# Patient Record
Sex: Male | Born: 1975 | Race: Black or African American | Hispanic: No | Marital: Single | Smoking: Current every day smoker
Health system: Southern US, Community
[De-identification: ages and names within clinical notes are randomized; demographics above are authoritative.]

## PROBLEM LIST (undated history)

## (undated) DIAGNOSIS — I10 Essential (primary) hypertension: Secondary | ICD-10-CM

## (undated) DIAGNOSIS — E119 Type 2 diabetes mellitus without complications: Secondary | ICD-10-CM

---

## 2020-02-16 ENCOUNTER — Encounter (HOSPITAL_COMMUNITY): Payer: Self-pay | Admitting: Emergency Medicine

## 2020-02-16 ENCOUNTER — Emergency Department (HOSPITAL_COMMUNITY): Payer: No Typology Code available for payment source

## 2020-02-16 ENCOUNTER — Emergency Department (HOSPITAL_COMMUNITY)
Admission: EM | Admit: 2020-02-16 | Discharge: 2020-02-17 | Disposition: A | Discharge status: Home / Self Care | Payer: No Typology Code available for payment source | Attending: Emergency Medicine | Admitting: Emergency Medicine

## 2020-02-16 ENCOUNTER — Other Ambulatory Visit: Payer: Self-pay

## 2020-02-16 DIAGNOSIS — Y999 Unspecified external cause status: Secondary | ICD-10-CM | POA: Insufficient documentation

## 2020-02-16 DIAGNOSIS — I10 Essential (primary) hypertension: Secondary | ICD-10-CM | POA: Diagnosis not present

## 2020-02-16 DIAGNOSIS — R109 Unspecified abdominal pain: Secondary | ICD-10-CM | POA: Diagnosis not present

## 2020-02-16 DIAGNOSIS — R0789 Other chest pain: Secondary | ICD-10-CM | POA: Diagnosis not present

## 2020-02-16 DIAGNOSIS — M545 Low back pain: Secondary | ICD-10-CM | POA: Insufficient documentation

## 2020-02-16 DIAGNOSIS — Y92410 Unspecified street and highway as the place of occurrence of the external cause: Secondary | ICD-10-CM | POA: Insufficient documentation

## 2020-02-16 DIAGNOSIS — Y93I9 Activity, other involving external motion: Secondary | ICD-10-CM | POA: Diagnosis not present

## 2020-02-16 DIAGNOSIS — K8689 Other specified diseases of pancreas: Secondary | ICD-10-CM

## 2020-02-16 DIAGNOSIS — T1490XA Injury, unspecified, initial encounter: Secondary | ICD-10-CM

## 2020-02-16 DIAGNOSIS — Z7984 Long term (current) use of oral hypoglycemic drugs: Secondary | ICD-10-CM | POA: Insufficient documentation

## 2020-02-16 DIAGNOSIS — T07XXXA Unspecified multiple injuries, initial encounter: Secondary | ICD-10-CM

## 2020-02-16 DIAGNOSIS — R55 Syncope and collapse: Secondary | ICD-10-CM | POA: Insufficient documentation

## 2020-02-16 DIAGNOSIS — F17210 Nicotine dependence, cigarettes, uncomplicated: Secondary | ICD-10-CM | POA: Diagnosis not present

## 2020-02-16 DIAGNOSIS — Z79899 Other long term (current) drug therapy: Secondary | ICD-10-CM | POA: Diagnosis not present

## 2020-02-16 DIAGNOSIS — T148XXA Other injury of unspecified body region, initial encounter: Secondary | ICD-10-CM

## 2020-02-16 DIAGNOSIS — E119 Type 2 diabetes mellitus without complications: Secondary | ICD-10-CM | POA: Insufficient documentation

## 2020-02-16 HISTORY — DX: Essential (primary) hypertension: I10

## 2020-02-16 HISTORY — DX: Type 2 diabetes mellitus without complications: E11.9

## 2020-02-16 LAB — CBC
HCT: 49.8 % (ref 39.0–52.0)
Hemoglobin: 16 g/dL (ref 13.0–17.0)
MCH: 29.3 pg (ref 26.0–34.0)
MCHC: 32.1 g/dL (ref 30.0–36.0)
MCV: 91.2 fL (ref 80.0–100.0)
Platelets: 322 10*3/uL (ref 150–400)
RBC: 5.46 MIL/uL (ref 4.22–5.81)
RDW: 13.1 % (ref 11.5–15.5)
WBC: 6.4 10*3/uL (ref 4.0–10.5)
nRBC: 0 % (ref 0.0–0.2)

## 2020-02-16 LAB — I-STAT CHEM 8, ED
BUN: 8 mg/dL (ref 6–20)
Calcium, Ion: 1.13 mmol/L — ABNORMAL LOW (ref 1.15–1.40)
Chloride: 103 mmol/L (ref 98–111)
Creatinine, Ser: 0.8 mg/dL (ref 0.61–1.24)
Glucose, Bld: 225 mg/dL — ABNORMAL HIGH (ref 70–99)
HCT: 47 % (ref 39.0–52.0)
Hemoglobin: 16 g/dL (ref 13.0–17.0)
Potassium: 3.5 mmol/L (ref 3.5–5.1)
Sodium: 140 mmol/L (ref 135–145)
TCO2: 26 mmol/L (ref 22–32)

## 2020-02-16 LAB — SAMPLE TO BLOOD BANK

## 2020-02-16 LAB — COMPREHENSIVE METABOLIC PANEL
ALT: 31 U/L (ref 0–44)
AST: 26 U/L (ref 15–41)
Albumin: 3.7 g/dL (ref 3.5–5.0)
Alkaline Phosphatase: 75 U/L (ref 38–126)
Anion gap: 11 (ref 5–15)
BUN: 7 mg/dL (ref 6–20)
CO2: 23 mmol/L (ref 22–32)
Calcium: 9 mg/dL (ref 8.9–10.3)
Chloride: 104 mmol/L (ref 98–111)
Creatinine, Ser: 0.92 mg/dL (ref 0.61–1.24)
GFR calc Af Amer: >60 mL/min (ref >60)
GFR calc non Af Amer: >60 mL/min (ref >60)
Glucose, Bld: 232 mg/dL — ABNORMAL HIGH (ref 70–99)
Potassium: 3.5 mmol/L (ref 3.5–5.1)
Sodium: 138 mmol/L (ref 135–145)
Total Bilirubin: 1.1 mg/dL (ref 0.3–1.2)
Total Protein: 6.3 g/dL — ABNORMAL LOW (ref 6.5–8.1)

## 2020-02-16 LAB — URINALYSIS, ROUTINE W REFLEX MICROSCOPIC
Bilirubin Urine: NEGATIVE
Glucose, UA: 150 mg/dL — AB
Hgb urine dipstick: NEGATIVE
Ketones, ur: NEGATIVE mg/dL
Nitrite: NEGATIVE
Protein, ur: 30 mg/dL — AB
Specific Gravity, Urine: >1.046 — ABNORMAL HIGH (ref 1.005–1.030)
WBC, UA: >50 WBC/hpf — ABNORMAL HIGH (ref 0–5)
pH: 5 (ref 5.0–8.0)

## 2020-02-16 LAB — LACTIC ACID, PLASMA: Lactic Acid, Venous: 2.2 mmol/L (ref 0.5–1.9)

## 2020-02-16 LAB — ETHANOL: Alcohol, Ethyl (B): <10 mg/dL (ref <10)

## 2020-02-16 LAB — PROTIME-INR
INR: 0.9 (ref 0.8–1.2)
Prothrombin Time: 12 seconds (ref 11.4–15.2)

## 2020-02-16 MED ORDER — HYDROMORPHONE HCL 1 MG/ML IJ SOLN
1.0000 mg | Freq: Once | INTRAMUSCULAR | Status: AC
  Administered 2020-02-16: 1 mg via INTRAVENOUS
  Filled 2020-02-16: qty 1

## 2020-02-16 MED ORDER — SODIUM CHLORIDE 0.9 % IV BOLUS
1000.0000 mL | Freq: Once | INTRAVENOUS | Status: AC
  Administered 2020-02-16: 1000 mL via INTRAVENOUS

## 2020-02-16 MED ORDER — IOHEXOL 300 MG/ML  SOLN
100.0000 mL | Freq: Once | INTRAMUSCULAR | Status: AC | PRN
  Administered 2020-02-16: 100 mL via INTRAVENOUS

## 2020-02-16 MED ORDER — CYCLOBENZAPRINE HCL 10 MG PO TABS
10.0000 mg | ORAL_TABLET | Freq: Two times a day (BID) | ORAL | 0 refills | Status: DC | PRN
Start: 2020-02-16 — End: 2023-08-22

## 2020-02-16 MED ORDER — OXYCODONE HCL 5 MG PO TABS
5.0000 mg | ORAL_TABLET | Freq: Once | ORAL | Status: AC
  Administered 2020-02-17: 5 mg via ORAL
  Filled 2020-02-16: qty 1

## 2020-02-16 MED ORDER — TETANUS-DIPHTH-ACELL PERTUSSIS 5-2.5-18.5 LF-MCG/0.5 IM SUSP: 0.5000 mL | Freq: Once | INTRAMUSCULAR | Status: DC
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
## 2020-02-16 NOTE — Discharge Instructions (Signed)
You were evaluated in the Emergency Department and after careful evaluation, we did not find any emergent condition requiring admission or further testing in the hospital.  Your exam/testing today is overall reassuring.  Your CT scans did not show any significant traumatic injuries.  We did find a mass on your pancreas, which we talked about.  It is important that you follow-up with a primary care within the next few weeks doctor to discuss this pancreatic mass and see what other testing is necessary.  We recommend Tylenol or Motrin at home for discomfort.  You can use the Flexeril muscle relaxer for more significant pain at home.  Please return to the Emergency Department if you experience any worsening of your condition.  We encourage you to follow up with a primary care provider.  Thank you for allowing [place] to be a part of your care.

## 2020-02-16 NOTE — Consult Note (Signed)
Responded to referral from day chaplain, pt unavailable, no family present. Let Security know that pt has been moved to rm 20.   Rev. Donnel Saxon Chaplain
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
## 2020-02-16 NOTE — ED Notes (Signed)
Called [employer] for pt to notify them he would not be able to make it to work.

## 2020-02-17 NOTE — ED Notes (Signed)
Pt successfully ambulated around room.

## 2021-07-11 ENCOUNTER — Other Ambulatory Visit: Payer: Self-pay

## 2021-07-11 ENCOUNTER — Ambulatory Visit (HOSPITAL_COMMUNITY)
Admission: EM | Admit: 2021-07-11 | Discharge: 2021-07-11 | Disposition: A | Discharge status: Home / Self Care | Payer: Self-pay | Attending: Internal Medicine | Admitting: Internal Medicine

## 2021-07-11 DIAGNOSIS — K047 Periapical abscess without sinus: Secondary | ICD-10-CM

## 2021-07-11 MED ORDER — AMOXICILLIN-POT CLAVULANATE 875-125 MG PO TABS
1.0000 | ORAL_TABLET | Freq: Two times a day (BID) | ORAL | 0 refills | Status: AC
Start: 2021-07-11 — End: 2021-07-21

## 2021-07-11 NOTE — ED Triage Notes (Signed)
Pt presents with left side dental pain & swelling X 3 days.

## 2021-07-11 NOTE — Discharge Instructions (Signed)
Go ahead and pick up antibiotic this evening.  Take twice daily until completely gone.  Please return should you develop any worsening swelling, fever or chills.  You need to follow-up with dentist.  I have given you a referral number.  Do your best to quit tobacco use.

## 2021-07-11 NOTE — ED Provider Notes (Signed)
[facility]    CSN: [id number] Arrival date & time: 07/11/21  1845      History   Chief Complaint Chief Complaint  Patient presents with   Dental Pain    HPI Bishop Vanderwerf is a 45 y.o. male with PMH of hypertension and diabetes and tobacco abuse presents to urgent care today with complaints of dental pain with swelling to left side of mouth for 3 days.  Patient reports history of dental infections in the past.  States he just applied for dental insurance with his job and should     Past Medical History:  Diagnosis Date   Diabetes mellitus without complication (HCC)    Hypertension     There are no problems to display for this patient.   No past surgical history on file.     Home Medications    Prior to Admission medications   Medication Sig Start Date End Date Taking? Authorizing Provider  amoxicillin-clavulanate (AUGMENTIN) 875-125 MG tablet Take 1 tablet by mouth 2 (two) times daily for 10 days. 07/11/21 07/21/21 Yes Rolla Etienne, NP  cyclobenzaprine (FLEXERIL) 10 MG tablet Take 1 tablet (10 mg total) by mouth 2 (two) times daily as needed for muscle spasms. 02/16/20   Sabas Sous, MD    Family History No family history on file.  Social History Social History   Tobacco Use   Smoking status: Every Day   Smokeless tobacco: Never  Substance Use Topics   Alcohol use: Not Currently   Drug use: Not Currently     Allergies   Patient has no known allergies.   Review of Systems As stated in HPI otherwise negative   Physical Exam Triage Vital Signs ED Triage Vitals  Enc Vitals Group     BP 07/11/21 1940 (!) 152/94     Pulse Rate 07/11/21 1940 63     Resp 07/11/21 1940 18     Temp 07/11/21 1940 98.6 F (37 C)     Temp Source 07/11/21 1940 Oral     SpO2 07/11/21 1940 96 %     Weight --      Height --      Head Circumference --      Peak Flow --      Pain Score 07/11/21 1943 7     Pain Loc --      Pain Edu? --      Excl. in GC?  --    No data found.  Updated Vital Signs BP (!) 152/94 (BP Location: Right Arm)   Pulse 63   Temp 98.6 F (37 C) (Oral)   Resp 18   SpO2 96%   Visual Acuity Right Eye Distance:   Left Eye Distance:   Bilateral Distance:    Right Eye Near:   Left Eye Near:    Bilateral Near:     Physical Exam Constitutional:      General: He is not in acute distress.    Appearance: Normal appearance. He is not ill-appearing or toxic-appearing.  HENT:     Mouth/Throat:     Mouth: Mucous membranes are moist. No angioedema.     Dentition: Dental tenderness, gingival swelling and dental caries present.     Tongue: No lesions. Tongue does not deviate from midline.     Palate: No mass and lesions.     Pharynx: Oropharynx is clear. Uvula midline. No pharyngeal swelling or posterior oropharyngeal erythema.      Comments: Swelling and erythema  to left lower gumline.  Swelling to left buccal region.  No palpated abscess, no obvious lesion Neurological:     Mental Status: He is alert.     UC Treatments / Results  Labs (all labs ordered are listed, but only abnormal results are displayed) Labs Reviewed - No data to display  EKG   Radiology No results found.  Procedures Procedures (including critical care time)  Medications Ordered in UC Medications - No data to display  Initial Impression / Assessment and Plan / UC Course  I have reviewed the triage vital signs and the nursing notes.  Pertinent labs & imaging results that were available during my care of the patient were reviewed by me and considered in my medical decision making (see chart for details).  Dental Infection -Significant erythema with mild swelling of the lower gumline. Multiple caries -hx tobacco abuse and DM2 -Augmentin BID x 10d -dental referral as will likely need multiple tooth extractoins -f/u for worsening or persistent symtpoms  Reviewed expections re: course of current medical issues. Questions  answered. Outlined signs and symptoms indicating need for more acute intervention. Pt verbalized understanding. AVS given    Final Clinical Impressions(s) / UC Diagnoses   Final diagnoses:  Dental infection     Discharge Instructions      Go ahead and pick up antibiotic this evening.  Take twice daily until completely gone.  Please return should you develop any worsening swelling, fever or chills.  You need to follow-up with dentist.  I have given you a referral number.  Do your best to quit tobacco use.     ED Prescriptions     Medication Sig Dispense Auth. Provider   amoxicillin-clavulanate (AUGMENTIN) 875-125 MG tablet Take 1 tablet by mouth 2 (two) times daily for 10 days. 28 tablet Rolla Etienne, NP      PDMP not reviewed this encounter.   Rolla Etienne, NP 07/11/21 2009

## 2022-01-25 ENCOUNTER — Emergency Department (HOSPITAL_COMMUNITY): Payer: Self-pay

## 2022-01-25 ENCOUNTER — Emergency Department (HOSPITAL_COMMUNITY)
Admission: EM | Admit: 2022-01-25 | Discharge: 2022-01-26 | Disposition: A | Discharge status: Home / Self Care | Payer: Self-pay | Attending: Emergency Medicine | Admitting: Emergency Medicine

## 2022-01-25 ENCOUNTER — Encounter (HOSPITAL_COMMUNITY): Payer: Self-pay

## 2022-01-25 ENCOUNTER — Other Ambulatory Visit: Payer: Self-pay

## 2022-01-25 DIAGNOSIS — R634 Abnormal weight loss: Secondary | ICD-10-CM | POA: Insufficient documentation

## 2022-01-25 DIAGNOSIS — R42 Dizziness and giddiness: Secondary | ICD-10-CM | POA: Insufficient documentation

## 2022-01-25 DIAGNOSIS — I10 Essential (primary) hypertension: Secondary | ICD-10-CM | POA: Insufficient documentation

## 2022-01-25 DIAGNOSIS — E1165 Type 2 diabetes mellitus with hyperglycemia: Secondary | ICD-10-CM | POA: Insufficient documentation

## 2022-01-25 LAB — COMPREHENSIVE METABOLIC PANEL
ALT: 30 U/L (ref 0–44)
AST: 22 U/L (ref 15–41)
Albumin: 4.3 g/dL (ref 3.5–5.0)
Alkaline Phosphatase: 89 U/L (ref 38–126)
Anion gap: 8 (ref 5–15)
BUN: 12 mg/dL (ref 6–20)
CO2: 26 mmol/L (ref 22–32)
Calcium: 9.1 mg/dL (ref 8.9–10.3)
Chloride: 101 mmol/L (ref 98–111)
Creatinine, Ser: 0.78 mg/dL (ref 0.61–1.24)
GFR, Estimated: >60 mL/min (ref >60)
Glucose, Bld: 273 mg/dL — ABNORMAL HIGH (ref 70–99)
Potassium: 3.8 mmol/L (ref 3.5–5.1)
Sodium: 135 mmol/L (ref 135–145)
Total Bilirubin: 1.1 mg/dL (ref 0.3–1.2)
Total Protein: 7.4 g/dL (ref 6.5–8.1)

## 2022-01-25 LAB — CBC WITH DIFFERENTIAL/PLATELET
Abs Immature Granulocytes: 0.03 10*3/uL (ref 0.00–0.07)
Basophils Absolute: 0 10*3/uL (ref 0.0–0.1)
Basophils Relative: 1 %
Eosinophils Absolute: 0.2 10*3/uL (ref 0.0–0.5)
Eosinophils Relative: 2 %
HCT: 49 % (ref 39.0–52.0)
Hemoglobin: 16.7 g/dL (ref 13.0–17.0)
Immature Granulocytes: 0 %
Lymphocytes Relative: 40 %
Lymphs Abs: 3.5 10*3/uL (ref 0.7–4.0)
MCH: 29.7 pg (ref 26.0–34.0)
MCHC: 34.1 g/dL (ref 30.0–36.0)
MCV: 87 fL (ref 80.0–100.0)
Monocytes Absolute: 0.8 10*3/uL (ref 0.1–1.0)
Monocytes Relative: 10 %
Neutro Abs: 4.2 10*3/uL (ref 1.7–7.7)
Neutrophils Relative %: 47 %
Platelets: 246 10*3/uL (ref 150–400)
RBC: 5.63 MIL/uL (ref 4.22–5.81)
RDW: 13.6 % (ref 11.5–15.5)
WBC: 8.8 10*3/uL (ref 4.0–10.5)
nRBC: 0 % (ref 0.0–0.2)

## 2022-01-25 LAB — URINALYSIS, ROUTINE W REFLEX MICROSCOPIC
Bacteria, UA: NONE SEEN
Bilirubin Urine: NEGATIVE
Glucose, UA: >=500 mg/dL — AB
Hgb urine dipstick: NEGATIVE
Ketones, ur: 5 mg/dL — AB
Leukocytes,Ua: NEGATIVE
Nitrite: NEGATIVE
Protein, ur: 30 mg/dL — AB
Specific Gravity, Urine: 1.038 — ABNORMAL HIGH (ref 1.005–1.030)
pH: 5 (ref 5.0–8.0)

## 2022-01-25 LAB — LIPASE, BLOOD: Lipase: 41 U/L (ref 11–51)

## 2022-01-25 MED ORDER — KETOROLAC TROMETHAMINE 15 MG/ML IJ SOLN
15.0000 mg | Freq: Once | INTRAMUSCULAR | Status: AC
  Administered 2022-01-25: 15 mg via INTRAVENOUS
  Filled 2022-01-25: qty 1

## 2022-01-25 MED ORDER — IOHEXOL 300 MG/ML  SOLN
100.0000 mL | Freq: Once | INTRAMUSCULAR | Status: AC | PRN
  Administered 2022-01-25: 100 mL via INTRAVENOUS

## 2022-01-25 MED ORDER — METOCLOPRAMIDE HCL 5 MG/ML IJ SOLN
10.0000 mg | Freq: Once | INTRAMUSCULAR | Status: AC
  Administered 2022-01-25: 10 mg via INTRAVENOUS
  Filled 2022-01-25: qty 2

## 2022-01-25 MED ORDER — DIPHENHYDRAMINE HCL 50 MG/ML IJ SOLN
12.5000 mg | Freq: Once | INTRAMUSCULAR | Status: AC
  Administered 2022-01-25: 12.5 mg via INTRAVENOUS
  Filled 2022-01-25: qty 1

## 2022-01-25 MED ORDER — SODIUM CHLORIDE 0.9 % IV BOLUS
1000.0000 mL | Freq: Once | INTRAVENOUS | Status: AC
  Administered 2022-01-26: 1000 mL via INTRAVENOUS

## 2022-01-25 NOTE — ED Provider Notes (Signed)
[facility] DEPT Provider Note   CSN: [id number] Arrival date & time: 01/25/22  1722     History {Add pertinent medical, surgical, social history, OB history to HPI:1} Chief Complaint  Patient presents with   Weight Loss    Myking Jindra is a 46 y.o. male who presents with concern for multiple complaints.  His primary concern is that he reports he has been losing weight for the last 8 months.  He did not start talking until January and states he has lost over 30 pounds since January of this year (last 5 months).  In addition he is having recurrent unilateral headaches on the right side without any associated blurry or double vision but with some associated lightheadedness and intermittent dizziness.  Per chart review patient is post to be on medication for diabetes and hypertension, states he has not been on this for greater than 2 years since he moved to the area as he has not sought out a primary care physician.  States he has had increasing urinary frequency for the last several months as well as intermittent numbness and tingling sensation in the fingers and feet.  I have personally reviewed his medical records.  It appears patient was seen in the emergency department In June 2021 and at that time had a CT scan of the abdomen and pelvis that revealed a 5.7 x 3.4 cm distal pancreatic mass concerning for neoplasm; it was recommended that he have an outpatient MRI for further characterization of his pancreatic mass however patient did not pursue outpatient follow-up and has not been evaluated since that time. Intermittent epigastric and right upper abdominal pain without N/V/D. Change in appetite.   HPI     Home Medications Prior to Admission medications   Medication Sig Start Date End Date Taking? Authorizing Provider  cyclobenzaprine (FLEXERIL) 10 MG tablet Take 1 tablet (10 mg total) by mouth 2 (two) times daily as needed for muscle spasms. 02/16/20   Maudie Flakes, MD      Allergies    Patient has no known allergies.    Review of Systems   Review of Systems  Constitutional:  Positive for appetite change, fatigue and unexpected weight change.  HENT: Negative.    Eyes: Negative.   Respiratory:  Positive for cough. Negative for shortness of breath.   Cardiovascular: Negative.   Gastrointestinal:  Positive for abdominal pain.  Genitourinary:  Positive for frequency. Negative for dysuria and urgency.  Musculoskeletal: Negative.   Neurological:  Positive for dizziness, light-headedness, numbness and headaches.   Physical Exam Updated Vital Signs BP (!) 164/88 (BP Location: Left Arm)   Pulse (!) 132   Temp 98.1 F (36.7 C) (Oral)   Resp 16   Ht 5\' 9"  (1.753 m)   Wt 99.8 kg   SpO2 100%   BMI 32.49 kg/m  Physical Exam Vitals and nursing note reviewed.  Constitutional:      Appearance: He is not ill-appearing or toxic-appearing.  HENT:     Head: Normocephalic and atraumatic.     Nose: Nose normal.     Mouth/Throat:     Mouth: Mucous membranes are moist.     Pharynx: No oropharyngeal exudate or posterior oropharyngeal erythema.  Eyes:     General:        Right eye: No discharge.        Left eye: No discharge.     Extraocular Movements: Extraocular movements intact.     Conjunctiva/sclera: Conjunctivae normal.  Pupils: Pupils are equal, round, and reactive to light.  Cardiovascular:     Rate and Rhythm: Normal rate and regular rhythm.     Pulses: Normal pulses.          Dorsalis pedis pulses are 2+ on the right side and 2+ on the left side.     Heart sounds: Normal heart sounds. No murmur heard. Pulmonary:     Effort: Pulmonary effort is normal. No respiratory distress.     Breath sounds: Normal breath sounds. No wheezing or rales.  Abdominal:     General: Bowel sounds are normal. There is no distension.     Palpations: Abdomen is soft. There is no mass.     Tenderness: There is no abdominal tenderness. There is no  right CVA tenderness, left CVA tenderness, guarding or rebound.  Musculoskeletal:        General: No deformity.     Cervical back: Neck supple.     Right lower leg: No edema.     Left lower leg: No edema.  Skin:    General: Skin is warm and dry.     Capillary Refill: Capillary refill takes less than 2 seconds.  Neurological:     General: No focal deficit present.     Mental Status: He is alert and oriented to person, place, and time. Mental status is at baseline.     GCS: GCS eye subscore is 4. GCS verbal subscore is 5. GCS motor subscore is 6.     Cranial Nerves: Cranial nerves 2-12 are intact.     Sensory: Sensation is intact.     Motor: Motor function is intact.     Gait: Gait is intact.  Psychiatric:        Mood and Affect: Mood normal.    ED Results / Procedures / Treatments   Labs (all labs ordered are listed, but only abnormal results are displayed) Labs Reviewed  COMPREHENSIVE METABOLIC PANEL - Abnormal; Notable for the following components:      Result Value   Glucose, Bld 273 (*)    All other components within normal limits  URINALYSIS, ROUTINE W REFLEX MICROSCOPIC - Abnormal; Notable for the following components:   Specific Gravity, Urine 1.038 (*)    Glucose, UA >=500 (*)    Ketones, ur 5 (*)    Protein, ur 30 (*)    All other components within normal limits  CBC WITH DIFFERENTIAL/PLATELET  LIPASE, BLOOD    EKG None  Radiology DG Chest 2 View  Result Date: 01/25/2022 CLINICAL DATA:  Pain in shoulder. Weight loss over the past 8 months. EXAM: CHEST - 2 VIEW COMPARISON:  Radiograph and chest CT 02/16/2020 FINDINGS: The cardiomediastinal contours are normal. No pulmonary mass or visualized nodule. Pulmonary vasculature is normal. No consolidation, pleural effusion, or pneumothorax. Minimal acromioclavicular spurring of both shoulders. There is mild thoracic spondylosis. No acute osseous abnormalities are seen. IMPRESSION: Negative radiographs of the chest.  Electronically Signed   By: Narda RutherfordMelanie  Sanford M.D.   On: 01/25/2022 18:10   CT Head Wo Contrast  Result Date: 01/25/2022 CLINICAL DATA:  Headache, sudden, severe tingling EXAM: CT HEAD WITHOUT CONTRAST TECHNIQUE: Contiguous axial images were obtained from the base of the skull through the vertex without intravenous contrast. RADIATION DOSE REDUCTION: This exam was performed according to the departmental dose-optimization program which includes automated exposure control, adjustment of the mA and/or kV according to patient size and/or use of iterative reconstruction technique. COMPARISON:  February 16, 2020  FINDINGS: Brain: No evidence of acute infarction, hemorrhage, hydrocephalus, extra-axial collection or mass lesion/mass effect. Vascular: No hyperdense vessel or unexpected calcification. Skull: Normal. Negative for fracture or focal lesion. Sinuses/Orbits: No acute finding. Other: None. IMPRESSION: No acute intracranial abnormality. Electronically Signed   By: Valentino Saxon M.D.   On: 01/25/2022 18:05    Procedures Procedures  {Document cardiac monitor, telemetry assessment procedure when appropriate:1}  Medications Ordered in ED Medications - No data to display  ED Course/ Medical Decision Making/ A&P                           Medical Decision Making 46 year old male who presents with concern for weight loss x8 months, with greater than 30 pounds loss since the beginning of the year, with associated symptoms as well.   Hypertensive on intake and tachycardic.  Vital signs otherwise normal.  Cardiopulmonary and abdominal exam is benign at time my evaluation.  Neurovascular intact in all extremities.  Overall well-appearing.  No palpable mass on abdominal exam. No focal neuro deficit.   Given concern for large amount of weight loss last few months in context of previously identified pancreatic mass without follow-up, significant clinical concern for malignancy at this time. We will proceed with  CT scan of the chest, abdomen, and pelvis for completion of work-up.  May require MRI of the brain as well for recurrent headaches.  Amount and/or Complexity of Data Reviewed Labs:     Details: CBC without leukocytosis or anemia.  CMP with hyperglycemia of 273 but otherwise unremarkable.  UA with glucosuria, mild ketonuria and proteinuria but otherwise unremarkable with normal lipase.   ***  {Document critical care time when appropriate:1} {Document review of labs and clinical decision tools ie heart score, Chads2Vasc2 etc:1}  {Document your independent review of radiology images, and any outside records:1} {Document your discussion with family members, caretakers, and with consultants:1} {Document social determinants of health affecting pt's care:1} {Document your decision making why or why not admission, treatments were needed:1} Final Clinical Impression(s) / ED Diagnoses Final diagnoses:  None    Rx / DC Orders ED Discharge Orders     None

## 2022-01-25 NOTE — ED Provider Triage Note (Signed)
Emergency Medicine Provider Triage Evaluation Note  Karl Torres , a 46 y.o. male  was evaluated in triage.  Pt complains of tingliness and a sensation of being cold in the extremities for the past 3 to 4 months.  He also states that he is lost 20 to 30 pounds in the last 2 months.  Patient mentions he recently stopped eating chicken and meats because he was having a lot of pain in his abdomen.  None now.  No fevers or chills.  Review of Systems  Positive:  Negative: See above  Physical Exam  There were no vitals taken for this visit. Gen:   Awake, no distress   Resp:  Normal effort  MSK:   Moves extremities without difficulty  Other:    Medical Decision Making  Medically screening exam initiated at 5:44 PM.  Appropriate orders placed.  Karl Torres was informed that the remainder of the evaluation will be completed by another provider, this initial triage assessment does not replace that evaluation, and the importance of remaining in the ED until their evaluation is complete.     Karl Torres, Karl Torres 01/25/22 1745

## 2022-01-25 NOTE — ED Triage Notes (Signed)
Patient reports significant weight loss in the past 8 months.   Patient c/o headache, and extremities feel cold x 3-4 months. Patient states he feels like things are crawling in his skin. Patient does have a few raised areas on his skin.

## 2022-01-26 LAB — TSH: TSH: 2.459 u[IU]/mL (ref 0.350–4.500)

## 2022-01-26 NOTE — Discharge Instructions (Addendum)
You were seen in the ER today for your weight loss.   Your blood work,CT scan, and urine tests were reassuring.  You do have a mass on your pancreas though it is smaller than it was in 2021.  You need to follow-up with the gastroenterologist listed below to schedule an outpatient MRI for further characterization of this mass.  This is very important that he follow-up for further work-up.  While the exact cause of your weight loss remains unclear, there is no emergent problem identified in the ER today.  Please continue to eat and drink normally, follow-up with the free clinic listed below, and return to the ER with any new severe symptoms.

## 2022-03-03 ENCOUNTER — Inpatient Hospital Stay: Payer: Self-pay | Admitting: Family Medicine

## 2022-10-15 IMAGING — CT CT HEAD W/O CM
3 series · 15 of 47 positions shown, 18 images · non-contrast
Comparison: February 16, 2020

CLINICAL DATA: Headache, sudden, severe tingling



[Series 2: head wo · axial · 0.48mm/px · z∈[-155,-5]mm · 9 of 36 slices shown, 12 images]
[im 3/36  brain]
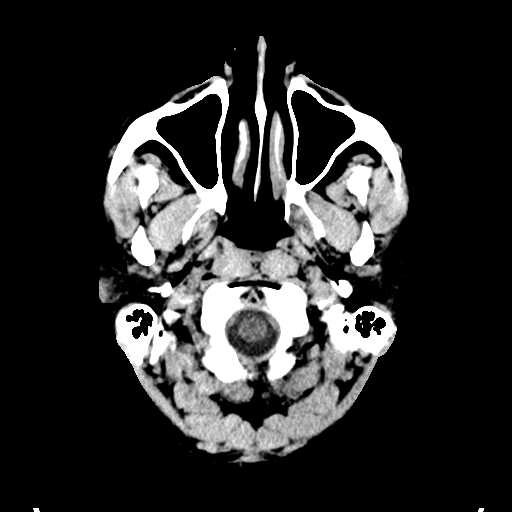
[im 3/36  bone]
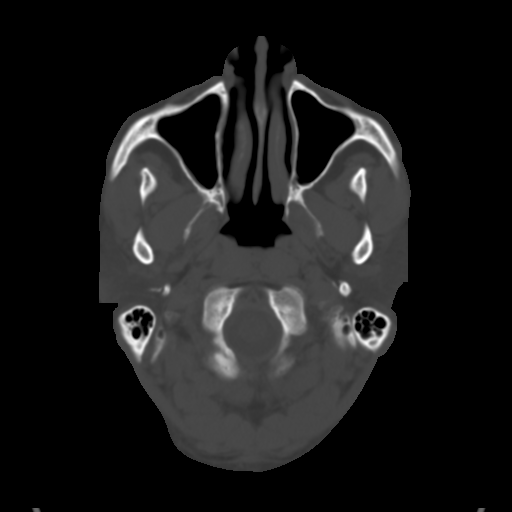
[im 7/36  brain]
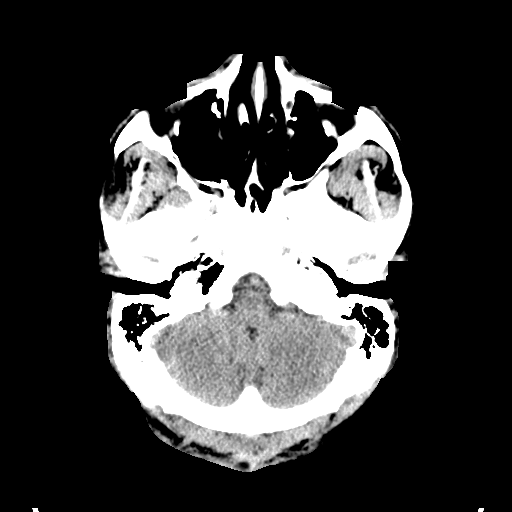
[im 10/36  brain]
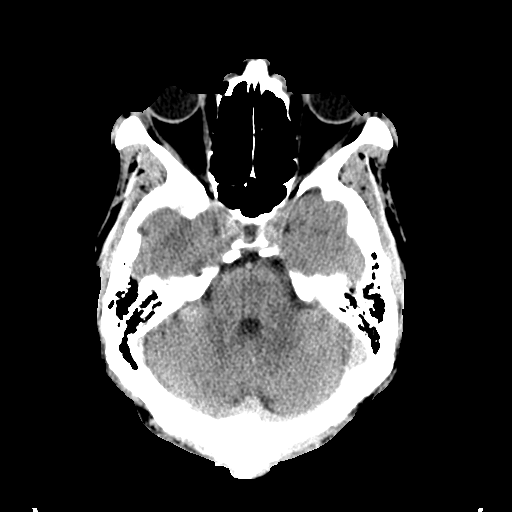
[im 14/36  brain]
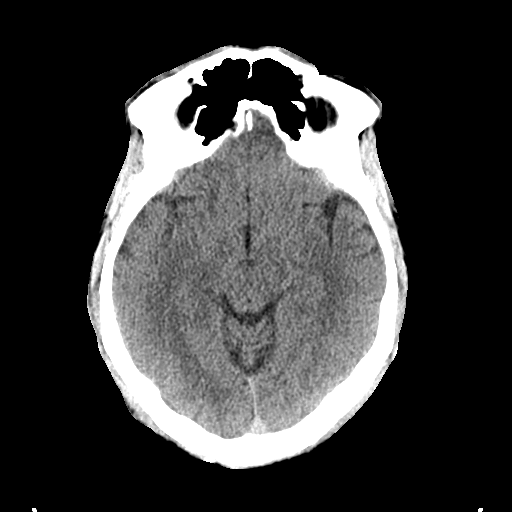
[im 19/36  brain]
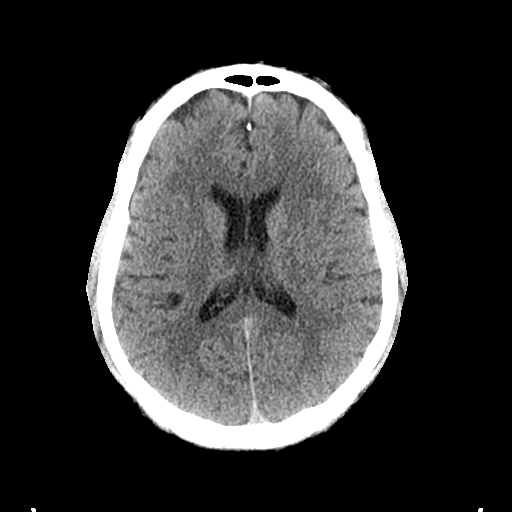
[im 19/36  bone]
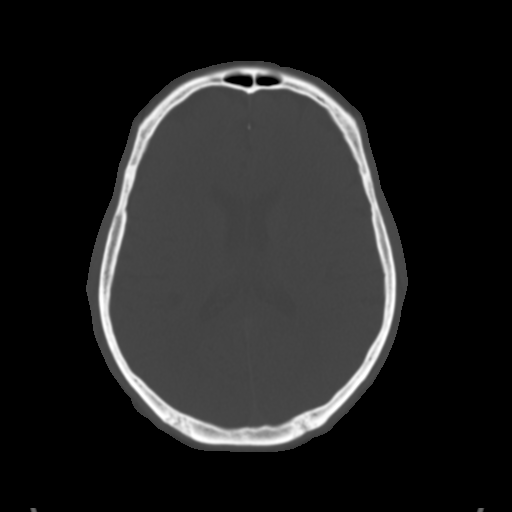
[im 22/36  brain]
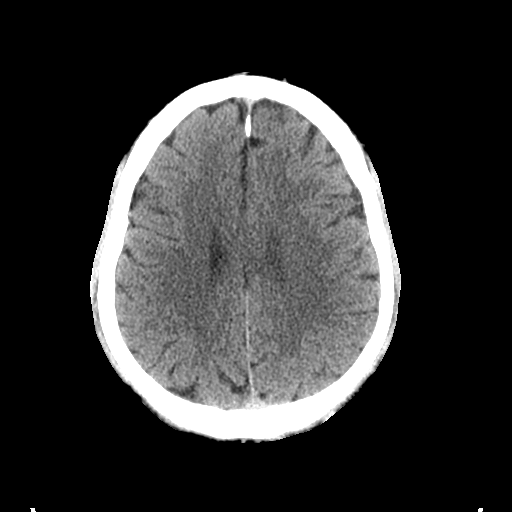
[im 26/36  brain]
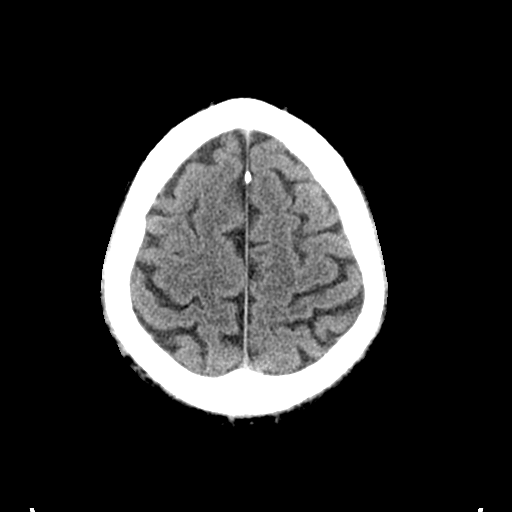
[im 29/36  brain]
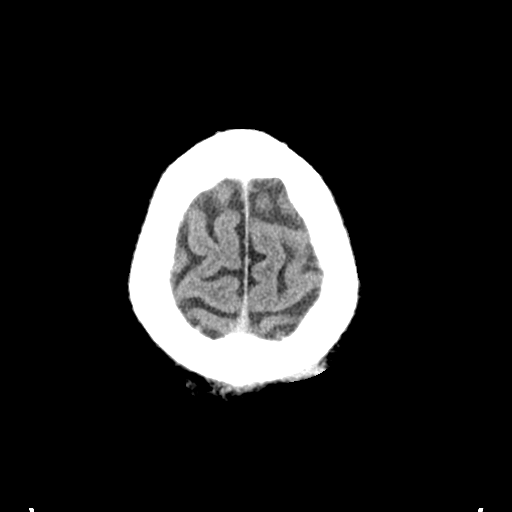
[im 33/36  brain]
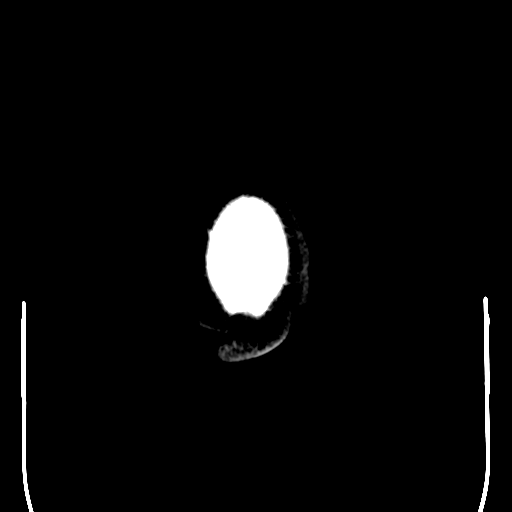
[im 33/36  bone]
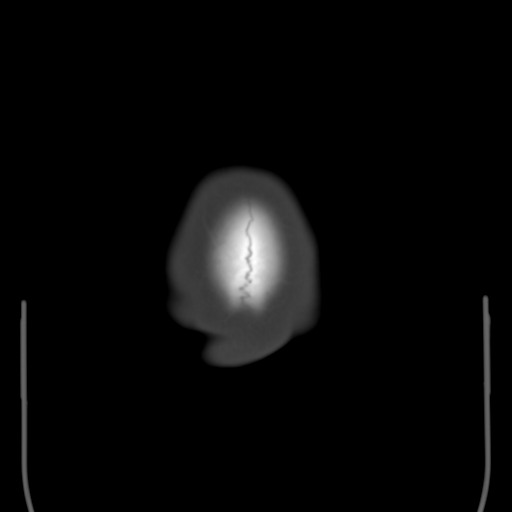

[Series 5: coronal soft tissue · coronal · 0.36mm/px · 3 of 79 slices shown]
[im 27/79  brain]
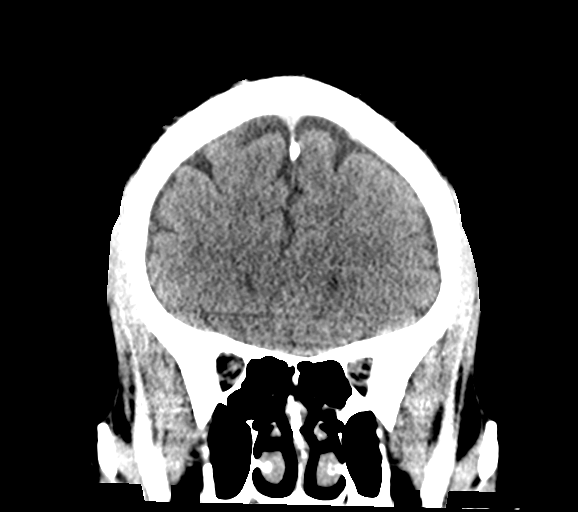
[im 35/79  brain]
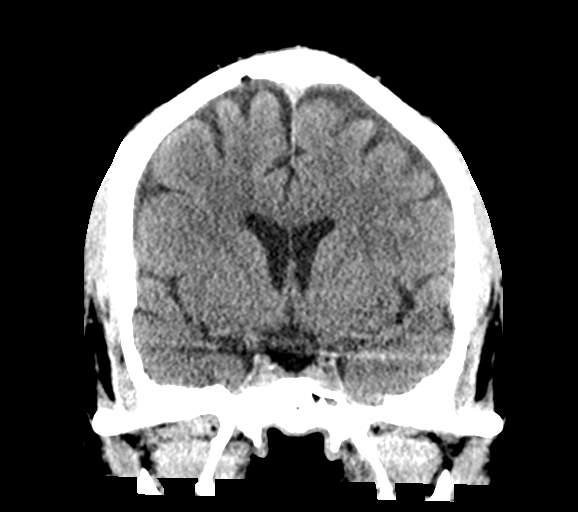
[im 44/79  brain]
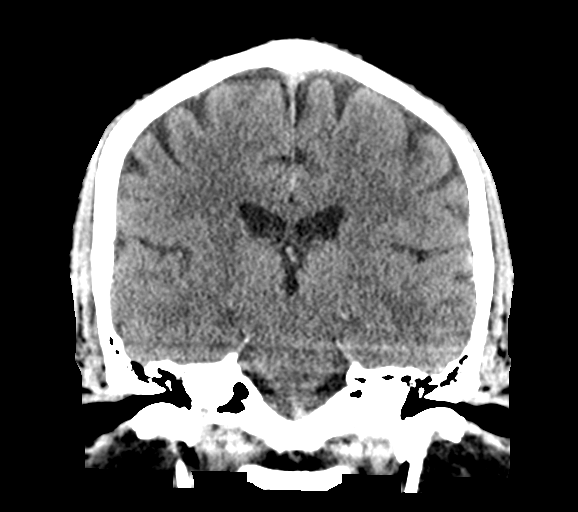

[Series 6: sagittal soft tissue · sagittal · 0.36mm/px · 3 of 70 slices shown]
[im 24/70  brain]
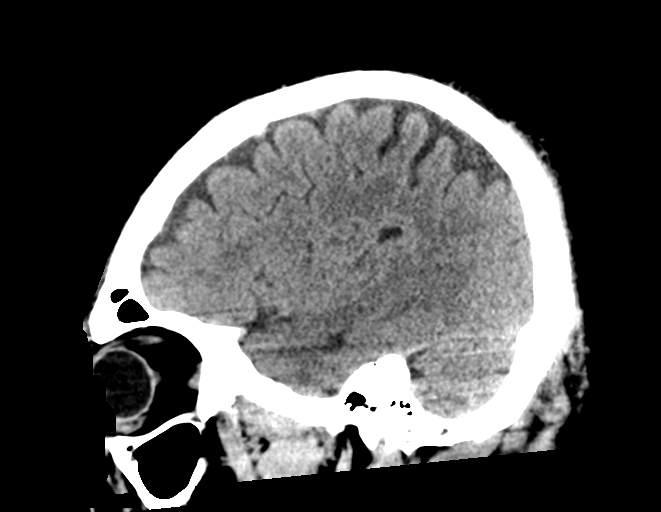
[im 35/70  brain]
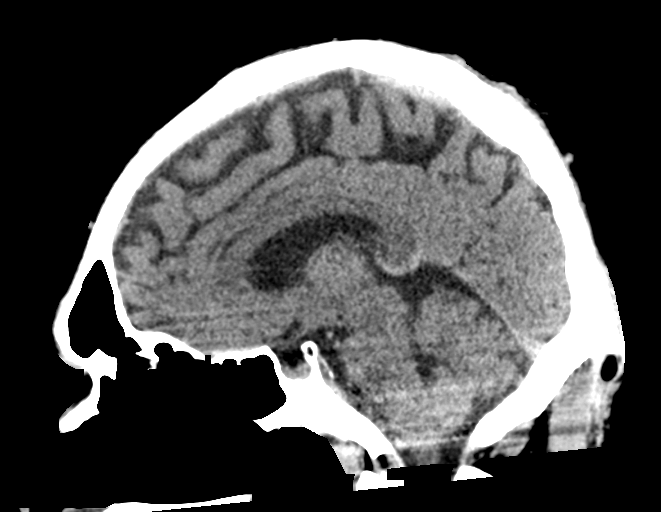
[im 47/70  brain]
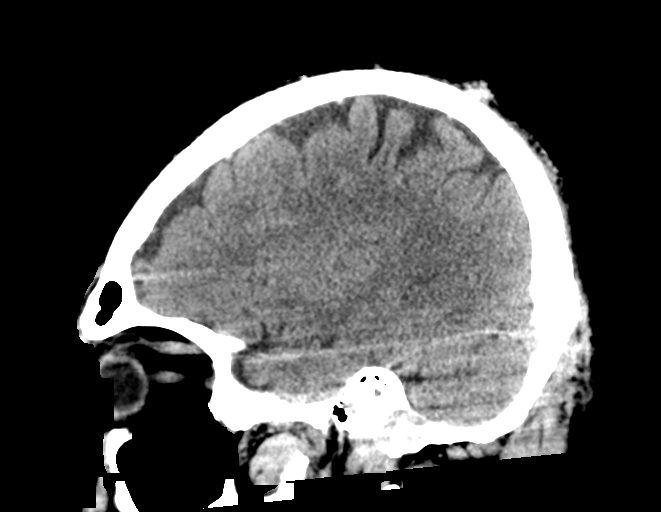

[15 of 47 positions shown; findings below may reference images not displayed]

FINDINGS: Brain: No evidence of acute infarction, hemorrhage, hydrocephalus,
extra-axial collection or mass lesion/mass effect.

Vascular: No hyperdense vessel or unexpected calcification.

Skull: Normal. Negative for fracture or focal lesion.

Sinuses/Orbits: No acute finding.

Other: None.
IMPRESSION: No acute intracranial abnormality.

## 2022-10-15 IMAGING — CT CT CHEST-ABD-PELV W/ CM
2 of 6 series · 10 of 36 positions shown, 15 images · IV contrast (agent unspecified)
Comparison: Chest abdomen pelvis CT 02/16/2020

CLINICAL DATA: Weight loss, unintended HX of neoplastic appearing
pancreatic mass in 8386, lost to follow up

EXAM:
CT CHEST, ABDOMEN, AND PELVIS WITH CONTRAST
TECHNIQUE: Multidetector CT imaging of the chest, abdomen and pelvis was
performed following the standard protocol during bolus
administration of intravenous contrast.

[Series 2: cap with · axial · 0.85mm/px · z∈[+1086,+1566]mm · 7 of 130 slices shown, 12 images]
[im 17/130  mediastinal]
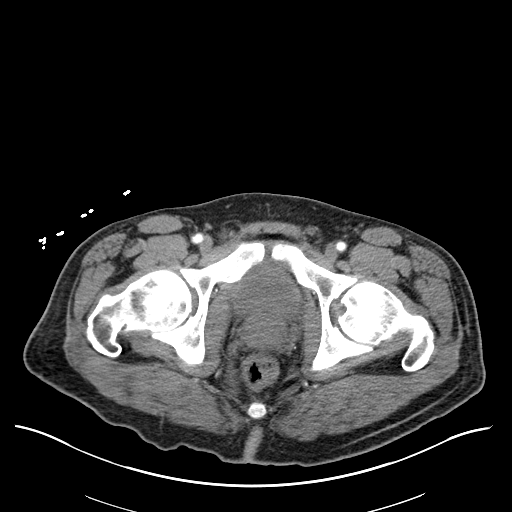
[im 17/130  bone]
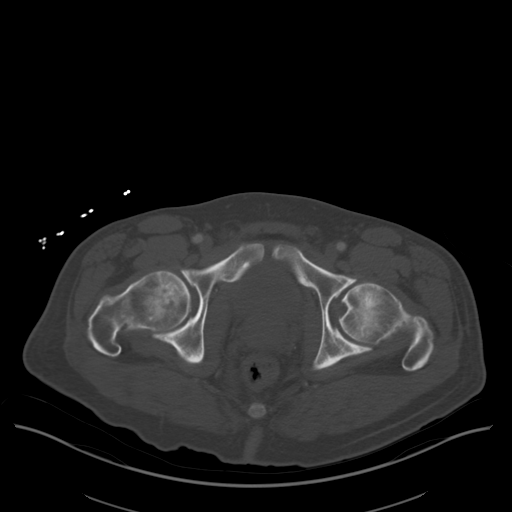
[im 33/130  mediastinal]
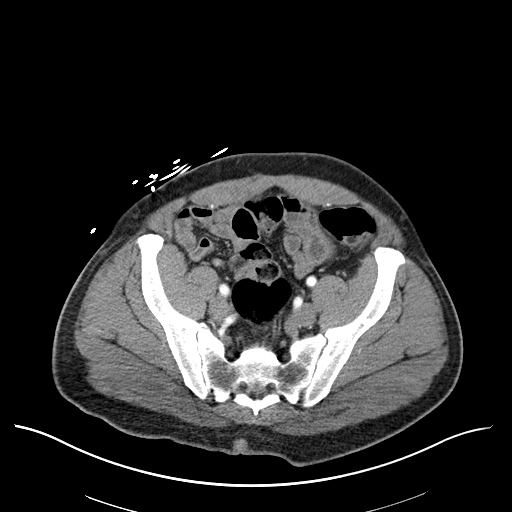
[im 49/130  mediastinal]
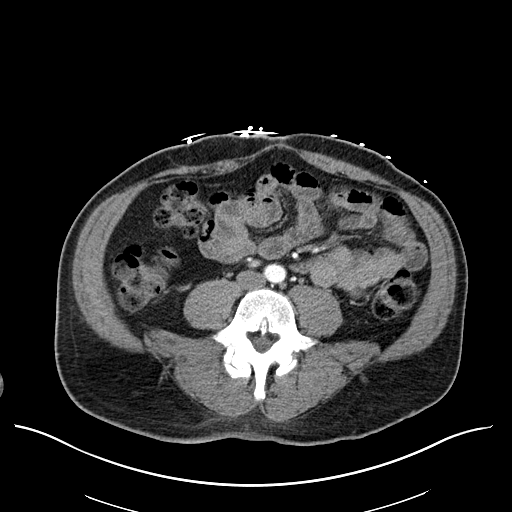
[im 65/130  mediastinal]
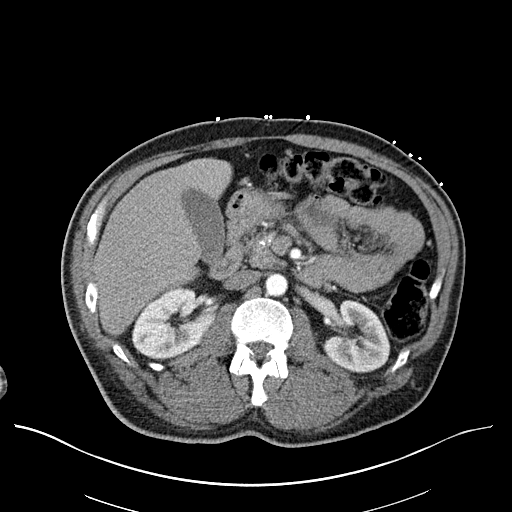
[im 65/130  lung]
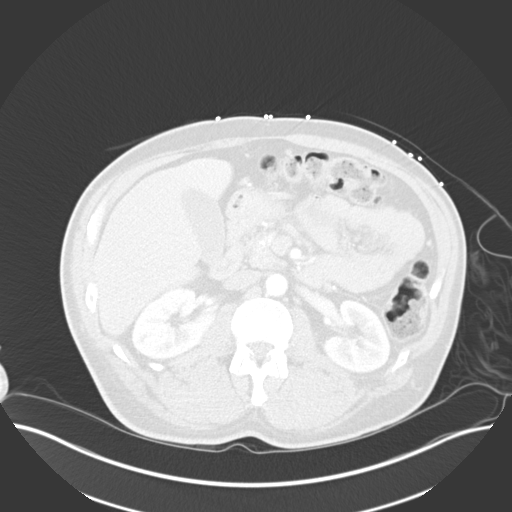
[im 81/130  mediastinal]
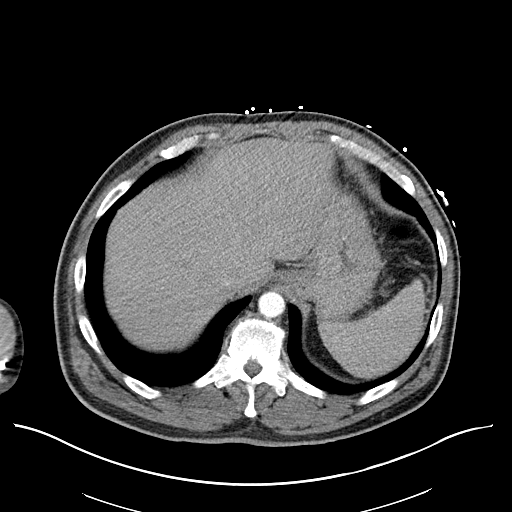
[im 81/130  lung]
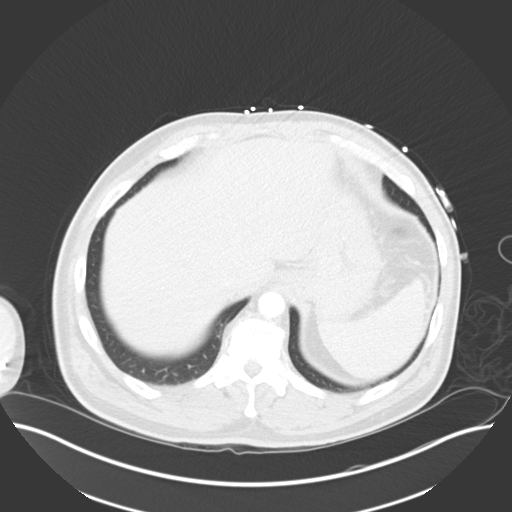
[im 97/130  mediastinal]
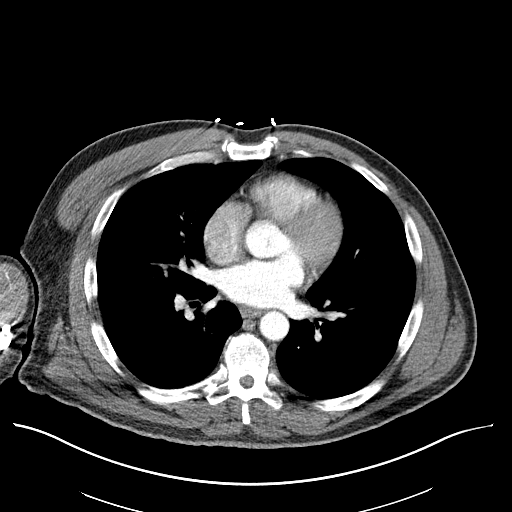
[im 97/130  lung]
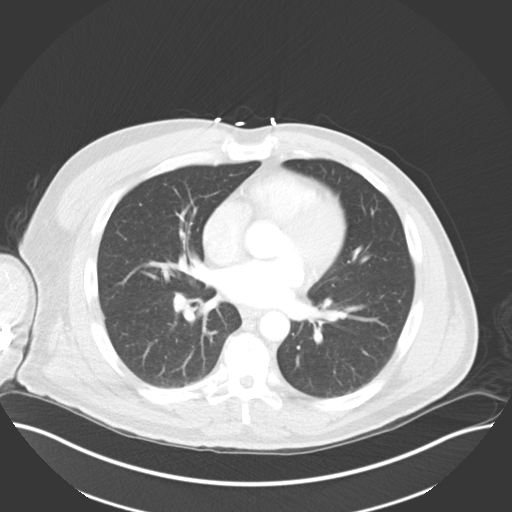
[im 113/130  mediastinal]
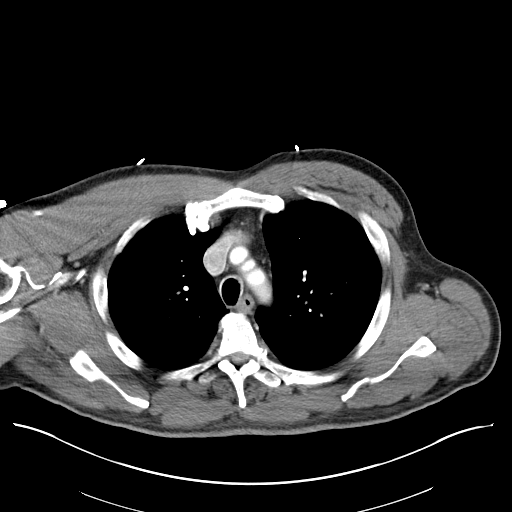
[im 113/130  lung]
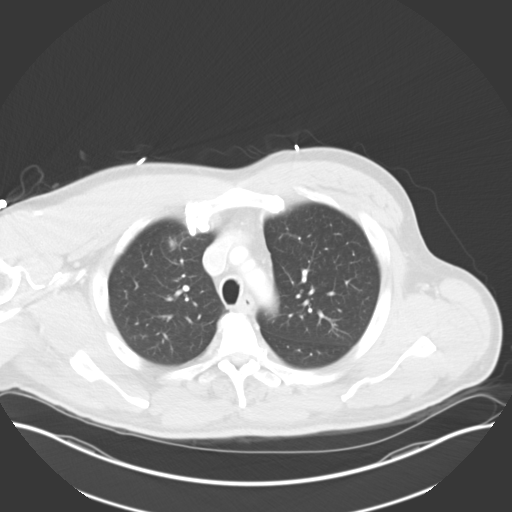

[Series 5: coronals · coronal · 0.84mm/px · 3 of 163 slices shown]
[im 33/163  mediastinal]
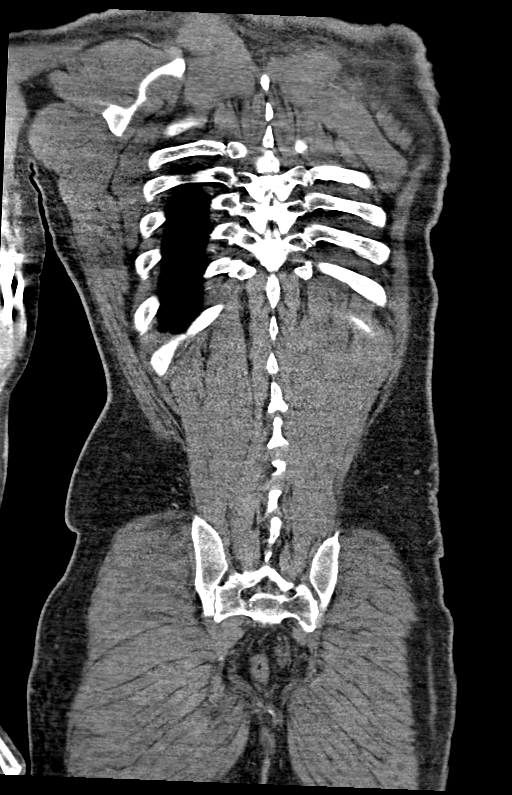
[im 65/163  mediastinal]
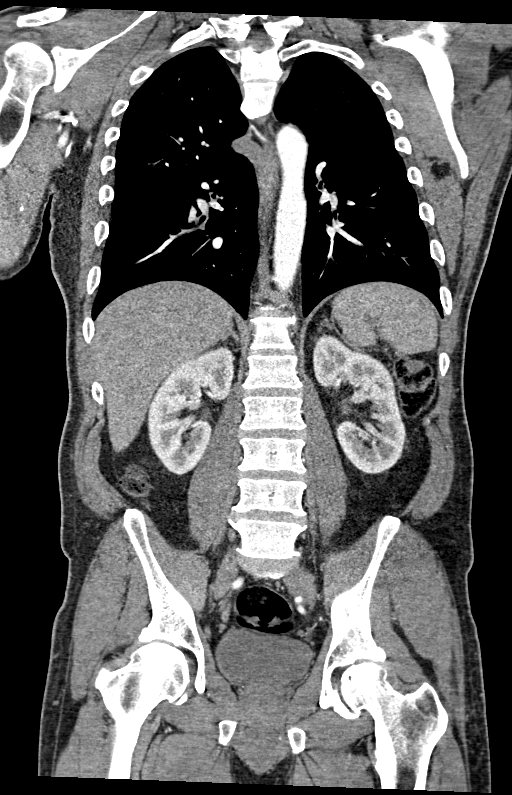
[im 98/163  mediastinal]
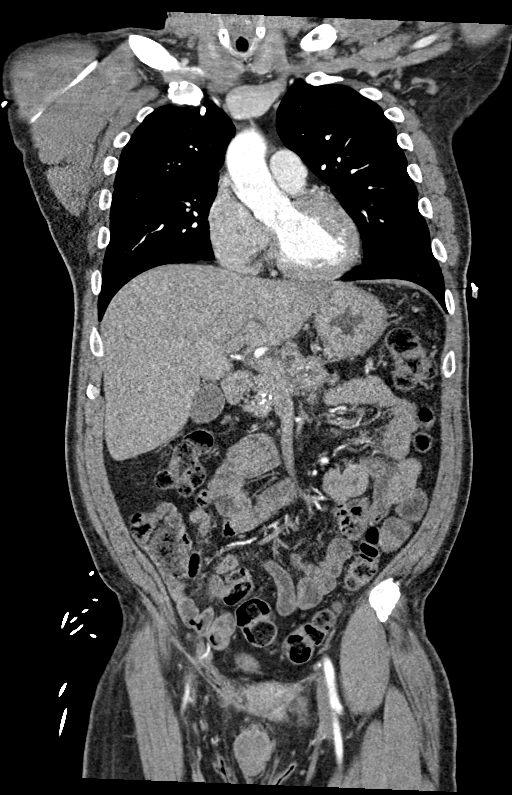

[10 of 36 positions shown; findings below may reference images not displayed]

RADIATION DOSE REDUCTION: This exam was performed according to the
departmental dose-optimization program which includes automated
exposure control, adjustment of the mA and/or kV according to
patient size and/or use of iterative reconstruction technique.

CONTRAST:  100mL OMNIPAQUE IOHEXOL 300 MG/ML  SOLN
FINDINGS: CT CHEST FINDINGS

Cardiovascular: The thoracic aorta is normal in caliber. The heart
is normal in size. No pericardial effusion. Exam not tailored to
pulmonary artery assessment, allowing for this no central pulmonary
embolus.

Mediastinum/Nodes: No mediastinal, hilar, or axillary adenopathy. No
thyroid nodule. The esophagus is decompressed.

Lungs/Pleura: No focal airspace consolidation, pleural effusion, or
pulmonary nodule. No pulmonary mass. There is mild central bronchial
thickening. No endobronchial lesion. Mild retained mucus in the
trachea.

Musculoskeletal: No focal bone lesion, bone destruction or acute
osseous findings. Diffuse thoracic spondylosis with spurring. No
chest wall soft tissue abnormalities.

CT ABDOMEN PELVIS FINDINGS

Hepatobiliary: Poorly defined 15 mm low-density in the central
liver, series 2, image 55, not definitively seen on prior exam. No
other hepatic abnormality. Gallbladder physiologically distended, no
calcified stone. No biliary dilatation.

Pancreas: Heterogeneous low-density lesion in the pancreatic tail
measures 4 x 3.6 cm, previously 5.7 x 3.4 cm. There is scattered
calcifications throughout the pancreatic parenchyma. Mild deltoid
dilatation in the pancreatic body at 6 mm. No peripancreatic fat
stranding.

Spleen: Normal in size without focal abnormality.

Adrenals/Urinary Tract: No adrenal nodule. No hydronephrosis or
perinephric edema. Homogeneous renal enhancement with symmetric
excretion on delayed phase imaging. No renal calculi or focal
lesion. Urinary bladder is physiologically distended without wall
thickening.

Stomach/Bowel: Detailed bowel assessment is limited in the absence
of enteric contrast stomach is nondistended. There is no bowel
obstruction or inflammation. The appendix is normal. Moderate volume
of colonic stool. No obvious bowel or colonic lesion.

Vascular/Lymphatic: Normal caliber abdominal aorta. Suspect splenic
vein occlusion, although this is not well assessed on the current
exam due to phase of contrast. Cannot assess portal vein patency.
There upper abdominal collaterals. Scattered small upper abdominal
and periportal lymph nodes, not enlarged by size criteria.

Reproductive: Prostate is unremarkable.

Other: No ascites. No omental thickening. No abdominal wall hernia.

Musculoskeletal: Avascular necrosis of the femoral heads without
collapse. Degenerative change in the hips and spine. Occasional
scattered bone islands. No suspicious bone lesion.
IMPRESSION: 1. Heterogeneous low-density lesion in the pancreatic tail measuring
4 x 3.6 cm, previously 5.7 x 3.4 cm. There is sequela of chronic
pancreatitis with pancreatic calcifications and mild ductal
dilatation in the mid body. Given decreased size, findings are
likely related to chronic pseudocyst. The possibility of pancreatic
neoplasm is not excluded, recommend pancreatic protocol MRI if
patient is able to tolerate breath hold technique.
2. There is a new 15 mm low-density lesion in the central liver,
which is nonspecific. This can also be assessed on MRI.
3. No other findings of neoplasm or malignancy in the chest,
abdomen, or pelvis.
4. No acute findings in the chest. Mild central bronchial
thickening, can be seen with bronchitis or reactive airways disease.
5. Avascular necrosis of the femoral heads without collapse.

## 2023-04-19 ENCOUNTER — Emergency Department (HOSPITAL_COMMUNITY)
Admission: EM | Admit: 2023-04-19 | Discharge: 2023-04-20 | Disposition: A | Discharge status: Home / Self Care | Payer: 59 | Attending: Emergency Medicine | Admitting: Emergency Medicine

## 2023-04-19 ENCOUNTER — Encounter (HOSPITAL_COMMUNITY): Payer: Self-pay

## 2023-04-19 ENCOUNTER — Other Ambulatory Visit: Payer: Self-pay

## 2023-04-19 DIAGNOSIS — E1165 Type 2 diabetes mellitus with hyperglycemia: Secondary | ICD-10-CM | POA: Insufficient documentation

## 2023-04-19 DIAGNOSIS — K861 Other chronic pancreatitis: Secondary | ICD-10-CM | POA: Diagnosis not present

## 2023-04-19 DIAGNOSIS — Z7984 Long term (current) use of oral hypoglycemic drugs: Secondary | ICD-10-CM | POA: Diagnosis not present

## 2023-04-19 DIAGNOSIS — M545 Low back pain, unspecified: Secondary | ICD-10-CM | POA: Diagnosis not present

## 2023-04-19 DIAGNOSIS — G8929 Other chronic pain: Secondary | ICD-10-CM | POA: Diagnosis not present

## 2023-04-19 DIAGNOSIS — M5416 Radiculopathy, lumbar region: Secondary | ICD-10-CM | POA: Insufficient documentation

## 2023-04-19 DIAGNOSIS — R739 Hyperglycemia, unspecified: Secondary | ICD-10-CM

## 2023-04-19 DIAGNOSIS — I10 Essential (primary) hypertension: Secondary | ICD-10-CM | POA: Diagnosis not present

## 2023-04-19 DIAGNOSIS — M4316 Spondylolisthesis, lumbar region: Secondary | ICD-10-CM | POA: Diagnosis not present

## 2023-04-19 DIAGNOSIS — M47816 Spondylosis without myelopathy or radiculopathy, lumbar region: Secondary | ICD-10-CM | POA: Diagnosis not present

## 2023-04-19 DIAGNOSIS — R634 Abnormal weight loss: Secondary | ICD-10-CM | POA: Insufficient documentation

## 2023-04-19 DIAGNOSIS — R202 Paresthesia of skin: Secondary | ICD-10-CM | POA: Diagnosis not present

## 2023-04-19 NOTE — ED Triage Notes (Signed)
Pt states he lost 100lbs since 2022. Pt states in the last 2 months he has lost 8 pounds. C/o left shin tingling down to foot. Pt states for 2 years he has had bilateral leg tingling.

## 2023-04-20 ENCOUNTER — Emergency Department (HOSPITAL_COMMUNITY): Payer: 59

## 2023-04-20 DIAGNOSIS — G8929 Other chronic pain: Secondary | ICD-10-CM | POA: Diagnosis not present

## 2023-04-20 DIAGNOSIS — R202 Paresthesia of skin: Secondary | ICD-10-CM | POA: Diagnosis not present

## 2023-04-20 DIAGNOSIS — M545 Low back pain, unspecified: Secondary | ICD-10-CM | POA: Diagnosis not present

## 2023-04-20 DIAGNOSIS — R634 Abnormal weight loss: Secondary | ICD-10-CM | POA: Diagnosis not present

## 2023-04-20 DIAGNOSIS — M4316 Spondylolisthesis, lumbar region: Secondary | ICD-10-CM | POA: Diagnosis not present

## 2023-04-20 DIAGNOSIS — M47816 Spondylosis without myelopathy or radiculopathy, lumbar region: Secondary | ICD-10-CM | POA: Diagnosis not present

## 2023-04-20 DIAGNOSIS — K861 Other chronic pancreatitis: Secondary | ICD-10-CM | POA: Diagnosis not present

## 2023-04-20 LAB — BASIC METABOLIC PANEL
Anion gap: 6 (ref 5–15)
BUN: 11 mg/dL (ref 6–20)
CO2: 26 mmol/L (ref 22–32)
Calcium: 8.5 mg/dL — ABNORMAL LOW (ref 8.9–10.3)
Chloride: 97 mmol/L — ABNORMAL LOW (ref 98–111)
Creatinine, Ser: 0.72 mg/dL (ref 0.61–1.24)
GFR, Estimated: >60 mL/min (ref >60)
Glucose, Bld: 472 mg/dL — ABNORMAL HIGH (ref 70–99)
Potassium: 3.7 mmol/L (ref 3.5–5.1)
Sodium: 129 mmol/L — ABNORMAL LOW (ref 135–145)

## 2023-04-20 LAB — CBC WITH DIFFERENTIAL/PLATELET
Abs Immature Granulocytes: 0.01 10*3/uL (ref 0.00–0.07)
Basophils Absolute: 0 10*3/uL (ref 0.0–0.1)
Basophils Relative: 1 %
Eosinophils Absolute: 0.1 10*3/uL (ref 0.0–0.5)
Eosinophils Relative: 2 %
HCT: 42.2 % (ref 39.0–52.0)
Hemoglobin: 14 g/dL (ref 13.0–17.0)
Immature Granulocytes: 0 %
Lymphocytes Relative: 40 %
Lymphs Abs: 2.3 10*3/uL (ref 0.7–4.0)
MCH: 29.9 pg (ref 26.0–34.0)
MCHC: 33.2 g/dL (ref 30.0–36.0)
MCV: 90.2 fL (ref 80.0–100.0)
Monocytes Absolute: 0.5 10*3/uL (ref 0.1–1.0)
Monocytes Relative: 10 %
Neutro Abs: 2.6 10*3/uL (ref 1.7–7.7)
Neutrophils Relative %: 47 %
Platelets: 220 10*3/uL (ref 150–400)
RBC: 4.68 MIL/uL (ref 4.22–5.81)
RDW: 13.2 % (ref 11.5–15.5)
WBC: 5.6 10*3/uL (ref 4.0–10.5)
nRBC: 0 % (ref 0.0–0.2)

## 2023-04-20 MED ORDER — METFORMIN HCL 500 MG PO TABS
500.0000 mg | ORAL_TABLET | Freq: Once | ORAL | Status: AC
  Administered 2023-04-20: 500 mg via ORAL
  Filled 2023-04-20: qty 1

## 2023-04-20 MED ORDER — IOHEXOL 300 MG/ML  SOLN
100.0000 mL | Freq: Once | INTRAMUSCULAR | Status: AC | PRN
  Administered 2023-04-20: 100 mL via INTRAVENOUS

## 2023-04-20 MED ORDER — METFORMIN HCL 500 MG PO TABS: 500.0000 mg | ORAL_TABLET | Freq: Two times a day (BID) | ORAL | 1 refills | Status: DC

## 2023-04-20 MED ORDER — SODIUM CHLORIDE (PF) 0.9 % IJ SOLN
INTRAMUSCULAR | Status: AC
  Filled 2023-04-20: qty 50

## 2023-04-20 NOTE — Discharge Instructions (Addendum)
Your imaging today was reassuring and did not show a concerning cause of your symptoms.  Your left foot and leg numbness, however, is likely related to a pinched nerve in your low back.  You may require additional imaging with an MRI, but this can be completed outside of an ED setting.  Symptoms necessitate follow-up with a primary care doctor.  Your blood sugar in the emergency department today was elevated consistent with your known history of diabetes.  You have been given a prescription for metformin for better control of your blood sugars.  Take as prescribed.  Have your blood sugar rechecked by a primary care doctor in 1 to 2 weeks.  Return to the ED for new or concerning symptoms.

## 2023-04-30 NOTE — ED Provider Notes (Signed)
[place] [facility]PARTMENT AT [facility] Provider Note   CSN: [id number] Arrival date & time: 04/19/23  2022     History  Chief Complaint  Patient presents with   Weight Loss    Karl Torres is a 47 y.o. male.  47 y/o male with hx of DM and HTN presents to the ED for multiple complaints; however, primary reason for ED visit today is related to some paresthesias to his LLE that he has noted for the past 2 days. Reports that the sensation in his L shin has been decreased. He feels as though it is more challenging for him to move his L foot. He does report some associated back pain, but denies trauma/fall, genital/perianal numbness, fevers, IVDU. Patient reports ongoing weight loss, but indicates that he has made some drastic dietary choices such as engaging in a 3 month long fruit fast. He has been off all medications for a number of years. Denies abdominal pain, persistent vomiting, melena, hematochezia. No prior abdominal surgeries. Does not have a PCP due to only recently becoming insured.  The history is provided by the patient. No language interpreter was used.       Home Medications Prior to Admission medications   Medication Sig Start Date End Date Taking? Authorizing Provider  metFORMIN (GLUCOPHAGE) 500 MG tablet Take 1 tablet (500 mg total) by mouth 2 (two) times daily with a meal. 04/20/23  Yes Antony Madura, PA-C  cyclobenzaprine (FLEXERIL) 10 MG tablet Take 1 tablet (10 mg total) by mouth 2 (two) times daily as needed for muscle spasms. Patient not taking: Reported on 01/26/2022 02/16/20   Sabas Sous, MD      Allergies    Patient has no known allergies.    Review of Systems   Review of Systems Ten systems reviewed and are negative for acute change, except as noted in the HPI.    Physical Exam Updated Vital Signs BP 111/85 (BP Location: Left Arm)   Pulse (!) 53   Temp 97.6 F (36.4 C) (Oral)   Resp 18   Ht 5\' 8"  (1.727 m)   Wt 81.6 kg   SpO2  100%   BMI 27.37 kg/m   Physical Exam Vitals and nursing note reviewed.  Constitutional:      General: He is not in acute distress.    Appearance: He is well-developed. He is not diaphoretic.     Comments: Nontoxic appearing, pleasant male  HENT:     Head: Normocephalic and atraumatic.  Eyes:     General: No scleral icterus.    Conjunctiva/sclera: Conjunctivae normal.  Cardiovascular:     Rate and Rhythm: Normal rate and regular rhythm.     Pulses: Normal pulses.     Comments: DP pulse 2+ in the LLE Pulmonary:     Effort: Pulmonary effort is normal. No respiratory distress.     Comments: Respirations even and unlabored Abdominal:     Comments: Soft, nontender abdomen.  Musculoskeletal:        General: Normal range of motion.     Cervical back: Normal range of motion.     Comments: No reproducible TTP to the back. No bony deformities, step offs, crepitus to the lumbosacral midline.   Skin:    General: Skin is warm and dry.     Coloration: Skin is not pale.     Findings: No erythema or rash.  Neurological:     Mental Status: He is alert and oriented to person,  place, and time.     Coordination: Coordination normal.     Comments: Moving all extremities spontaneously. Ambulatory w/o assistance. Subjective decreased sensation along the distal anterior L shin compared to right; 5/5 dorsiflexion and plantar flexion against resistance.  Psychiatric:        Behavior: Behavior normal.     ED Results / Procedures / Treatments   Labs (all labs ordered are listed, but only abnormal results are displayed) Labs Reviewed  BASIC METABOLIC PANEL - Abnormal; Notable for the following components:      Result Value   Sodium 129 (*)    Chloride 97 (*)    Glucose, Bld 472 (*)    Calcium 8.5 (*)    All other components within normal limits  CBC WITH DIFFERENTIAL/PLATELET    EKG None  Radiology No results found.  Procedures Procedures    Medications Ordered in ED Medications   iohexol (OMNIPAQUE) 300 MG/ML solution 100 mL (100 mLs Intravenous Contrast Given 04/20/23 0606)  metFORMIN (GLUCOPHAGE) tablet 500 mg (500 mg Oral Given 04/20/23 1610)    ED Course/ Medical Decision Making/ A&P                                 Medical Decision Making Amount and/or Complexity of Data Reviewed Labs: ordered. Radiology: ordered.  Risk Prescription drug management.   This patient presents to the ED for concern of LLE paresthesias, this involves an extensive number of treatment options, and is a complaint that carries with it a high risk of complications and morbidity.  The differential diagnosis includes radiculopathy vs diabetic neuropathy vs cauda equina vs osteomyelitis/discitis vs malignancy   Co morbidities that complicate the patient evaluation  DM HTN   Additional history obtained:  External records from outside source obtained and reviewed including CT abd/pelvis from 2021 questioning pancreatic malignancy; f/u CT chest/abd/pelvis in 2023 with shrinkage in size of pancreatic lesion, now felt to favor chronic pseudocyst   Lab Tests:  I Ordered, and personally interpreted labs.  The pertinent results include:  Glucose 472, Sodium 129, Chloride 97   Imaging Studies ordered:  I ordered imaging studies including Xray lumbar spine as well as CT abd/pelvis with no charge L-spine  I independently visualized and interpreted imaging which showed no changes in the lumbar spine. No evidence of metastatic disease. Pancreatic tail lesion again noted, favoring chronic pseudocyst I agree with the radiologist interpretation   Cardiac Monitoring:  The patient was maintained on a cardiac monitor.  I personally viewed and interpreted the cardiac monitored which showed an underlying rhythm of: sinus bradycardia   Medicines ordered and prescription drug management:  I ordered medication including Metformin for hyperglycemia  I have reviewed the patients home  medicines and have made adjustments as needed   Test Considered:  MRI lumbar spine - however, no red flags or signs concerning for cauda equina   Problem List / ED Course:  Patient with multiple complaints, seen sporadically in an ED setting over the past few years. Often noting weight loss, however, after further discussion with the patient I feel this most clinically correlates to some drastic lifestyle choices. Has seemingly been able to self manage his diabetes as a result of these choices, but CBG is elevated today into the 400's. No clinical concern for DKA. Will need to be restarted on Metformin for glycemic control. Now insured, so should have less of a barrier to PCP  f/u. Largest complaint today is of some paresthesias in the LLE. These sound mostly radicular. Advanced imaging was done given reported weight loss and prior CT imaging suggestive of pancreatic malignancy. Imaging today negative for fracture, significant listhesis. Also w/o concern for metastatic disease. Pancreatic lesion stable x3 years, favored to reflect chronic pseudocyst.  MRI considered, but without red flags or signs concerning for cauda equina will refer to PCP for follow up on this as well. OK for use of NSAIDs in the interim pending f/u.Marland Kitchen   Reevaluation:  After the interventions noted above, I reevaluated the patient and found that they have :stayed the same   Social Determinants of Health:  Lives independently   Dispostion:  After consideration of the diagnostic results and the patients response to treatment, I feel that the patent would benefit from outpatient PCP follow up. Advised initiation of metformin for diabetic management; will need CBG recheck in ~1 week. Return precautions discussed and provided. Patient discharged in stable condition with no unaddressed concerns.         Final Clinical Impression(s) / ED Diagnoses Final diagnoses:  Hyperglycemia  Lumbar radiculopathy    Rx / DC  Orders ED Discharge Orders          Ordered    metFORMIN (GLUCOPHAGE) 500 MG tablet  2 times daily with meals        04/20/23 0639              Antony Madura, PA-C 04/30/23 1436    Rancour, Jeannett Senior, MD 05/01/23 0900

## 2023-05-03 ENCOUNTER — Telehealth: Payer: Self-pay

## 2023-05-03 NOTE — Telephone Encounter (Signed)
Transition Care Management Follow-up Telephone Call Date of discharge and from where: Karl Torres 8/27 How have you been since you were released from the hospital? Doing fine and still trying to find a new PCP Any questions or concerns? Yes he feels like his concerns were not addressed in the ED  and he was rushed to be seen.   Items Reviewed: Did the pt receive and understand the discharge instructions provided? Yes  Medications obtained and verified? No  Other? No  Any new allergies since your discharge? No  Dietary orders reviewed? No Do you have support at home? No     Follow up appointments reviewed:  PCP Hospital f/u appt confirmed? No  Scheduled to see  on  @ . Specialist Hospital f/u appt confirmed? No  Scheduled to see  on  @ . Are transportation arrangements needed? No  If their condition worsens, is the pt aware to call PCP or go to the Emergency Dept.? No Was the patient provided with contact information for the PCP's office or ED? No Was to pt encouraged to call back with questions or concerns? No

## 2023-08-22 ENCOUNTER — Encounter (HOSPITAL_COMMUNITY): Payer: Self-pay | Admitting: Emergency Medicine

## 2023-08-22 ENCOUNTER — Ambulatory Visit (HOSPITAL_COMMUNITY)
Admission: EM | Admit: 2023-08-22 | Discharge: 2023-08-22 | Disposition: A | Discharge status: Home / Self Care | Payer: 59 | Attending: Family Medicine | Admitting: Family Medicine

## 2023-08-22 ENCOUNTER — Other Ambulatory Visit: Payer: Self-pay

## 2023-08-22 DIAGNOSIS — R634 Abnormal weight loss: Secondary | ICD-10-CM | POA: Diagnosis not present

## 2023-08-22 DIAGNOSIS — E0865 Diabetes mellitus due to underlying condition with hyperglycemia: Secondary | ICD-10-CM | POA: Insufficient documentation

## 2023-08-22 DIAGNOSIS — R350 Frequency of micturition: Secondary | ICD-10-CM | POA: Insufficient documentation

## 2023-08-22 LAB — COMPREHENSIVE METABOLIC PANEL
ALT: 136 U/L — ABNORMAL HIGH (ref 0–44)
AST: 260 U/L — ABNORMAL HIGH (ref 15–41)
Albumin: 3.5 g/dL (ref 3.5–5.0)
Alkaline Phosphatase: 91 U/L (ref 38–126)
Anion gap: 11 (ref 5–15)
BUN: 5 mg/dL — ABNORMAL LOW (ref 6–20)
CO2: 26 mmol/L (ref 22–32)
Calcium: 8.7 mg/dL — ABNORMAL LOW (ref 8.9–10.3)
Chloride: 93 mmol/L — ABNORMAL LOW (ref 98–111)
Creatinine, Ser: 0.75 mg/dL (ref 0.61–1.24)
GFR, Estimated: >60 mL/min (ref >60)
Glucose, Bld: 501 mg/dL (ref 70–99)
Potassium: 4.1 mmol/L (ref 3.5–5.1)
Sodium: 130 mmol/L — ABNORMAL LOW (ref 135–145)
Total Bilirubin: 1.4 mg/dL — ABNORMAL HIGH (ref <1.2)
Total Protein: 5.7 g/dL — ABNORMAL LOW (ref 6.5–8.1)

## 2023-08-22 LAB — POCT URINALYSIS DIP (MANUAL ENTRY)
Bilirubin, UA: NEGATIVE
Blood, UA: NEGATIVE
Glucose, UA: >=1000 mg/dL — AB
Ketones, POC UA: NEGATIVE mg/dL
Leukocytes, UA: NEGATIVE
Nitrite, UA: NEGATIVE
Protein Ur, POC: NEGATIVE mg/dL
Spec Grav, UA: 1.01 (ref 1.010–1.025)
Urobilinogen, UA: 0.2 U/dL
pH, UA: 6 (ref 5.0–8.0)

## 2023-08-22 LAB — CBC WITH DIFFERENTIAL/PLATELET
Abs Immature Granulocytes: 0.04 10*3/uL (ref 0.00–0.07)
Basophils Absolute: 0.1 10*3/uL (ref 0.0–0.1)
Basophils Relative: 1 %
Eosinophils Absolute: 0.1 10*3/uL (ref 0.0–0.5)
Eosinophils Relative: 1 %
HCT: 43.5 % (ref 39.0–52.0)
Hemoglobin: 14.9 g/dL (ref 13.0–17.0)
Immature Granulocytes: 1 %
Lymphocytes Relative: 34 %
Lymphs Abs: 2.4 10*3/uL (ref 0.7–4.0)
MCH: 30.7 pg (ref 26.0–34.0)
MCHC: 34.3 g/dL (ref 30.0–36.0)
MCV: 89.7 fL (ref 80.0–100.0)
Monocytes Absolute: 0.7 10*3/uL (ref 0.1–1.0)
Monocytes Relative: 10 %
Neutro Abs: 3.9 10*3/uL (ref 1.7–7.7)
Neutrophils Relative %: 53 %
Platelets: 213 10*3/uL (ref 150–400)
RBC: 4.85 MIL/uL (ref 4.22–5.81)
RDW: 12.8 % (ref 11.5–15.5)
WBC: 7.3 10*3/uL (ref 4.0–10.5)
nRBC: 0 % (ref 0.0–0.2)

## 2023-08-22 LAB — TSH: TSH: 0.608 u[IU]/mL (ref 0.350–4.500)

## 2023-08-22 LAB — POCT FASTING CBG KUC MANUAL ENTRY: POCT Glucose (KUC): 498 mg/dL — AB (ref 70–99)

## 2023-08-22 MED ORDER — METFORMIN HCL 500 MG PO TABS: 500.0000 mg | ORAL_TABLET | Freq: Two times a day (BID) | ORAL | 0 refills | Status: DC

## 2023-08-22 NOTE — ED Notes (Signed)
Patient drinking water .  Urinated while waiting in lobby-unable to hold urine.

## 2023-08-22 NOTE — ED Notes (Signed)
ESTABLISHED PATIENT WITH A PROVIDER AND GAVE COPY OF APPT TIME. PLACE, PHONE NUMBER ETC.

## 2023-08-22 NOTE — ED Triage Notes (Signed)
Complains of feeling weak for a month. Reports bilateral leg swelling, right greater than left.  Patient reports urinating frequently at night .  Multiple complaints.  Does not have a primary provider.  Acknowledges he needs a pcp

## 2023-08-22 NOTE — ED Provider Notes (Addendum)
[facility]    CSN: [id number] Arrival date & time: 08/22/23  1128      History   Chief Complaint Chief Complaint  Patient presents with   Urinary Tract Infection    HPI Karl Torres is a 47 y.o. male.   Here for several symptoms, most of them going on for some time.  To nursing staff he stated he was tired for about the last month and was having urinary frequency.  No dysuria or hematuria.  Appetite is possibly reduced, he notes that he has been losing weight.  Apparently this has been over several years.  He has had some sweats in the last year, and has some sneezing recently.  It is hard to tell if he has had any fever in the last week.  At some point in the visit he states he came to get his prostate checked.  He states because sometimes it is hard to walk and he feels swollen.  In August his sugar was 472 in the emergency room.  It looks like he was prescribed metformin but the patient states he was not unaware that he was not taking it.  Past Medical History:  Diagnosis Date   Diabetes mellitus without complication (HCC)    Hypertension     There are no active problems to display for this patient.   History reviewed. No pertinent surgical history.     Home Medications    Prior to Admission medications   Medication Sig Start Date End Date Taking? Authorizing Provider  metFORMIN (GLUCOPHAGE) 500 MG tablet Take 1 tablet (500 mg total) by mouth 2 (two) times daily with a meal. 08/22/23  Yes Asahel Risden, Janace Aris, MD    Family History Family History  Problem Relation Age of Onset   Cancer Mother    Hypertension Mother    Hypertension Father     Social History Social History   Tobacco Use   Smoking status: Every Day    Current packs/day: 0.50    Types: Cigarettes   Smokeless tobacco: Never  Vaping Use   Vaping status: Never Used  Substance Use Topics   Alcohol use: Yes   Drug use: Not Currently     Allergies   Patient has no known  allergies.   Review of Systems Review of Systems   Physical Exam Triage Vital Signs ED Triage Vitals  Encounter Vitals Group     BP 08/22/23 1215 124/84     Systolic BP Percentile --      Diastolic BP Percentile --      Pulse Rate 08/22/23 1215 78     Resp 08/22/23 1215 18     Temp 08/22/23 1215 99 F (37.2 C)     Temp Source 08/22/23 1215 Oral     SpO2 08/22/23 1215 96 %     Weight --      Height --      Head Circumference --      Peak Flow --      Pain Score 08/22/23 1213 0     Pain Loc --      Pain Education --      Exclude from Growth Chart --    No data found.  Updated Vital Signs BP 124/84 (BP Location: Right Arm)   Pulse 78   Temp 99 F (37.2 C) (Oral)   Resp 18   SpO2 96%   Visual Acuity Right Eye Distance:   Left Eye Distance:   Bilateral Distance:  Right Eye Near:   Left Eye Near:    Bilateral Near:     Physical Exam Vitals reviewed.  Constitutional:      General: He is not in acute distress.    Appearance: He is not toxic-appearing.  HENT:     Nose: Nose normal.     Mouth/Throat:     Mouth: Mucous membranes are moist.     Pharynx: No oropharyngeal exudate or posterior oropharyngeal erythema.  Eyes:     Extraocular Movements: Extraocular movements intact.     Conjunctiva/sclera: Conjunctivae normal.     Pupils: Pupils are equal, round, and reactive to light.  Cardiovascular:     Rate and Rhythm: Normal rate and regular rhythm.     Heart sounds: No murmur heard. Pulmonary:     Effort: Pulmonary effort is normal. No respiratory distress.     Breath sounds: Normal breath sounds. No stridor. No wheezing, rhonchi or rales.  Abdominal:     General: There is no distension.     Palpations: Abdomen is soft.     Tenderness: There is no abdominal tenderness. There is no guarding.  Musculoskeletal:     Cervical back: Neck supple.  Lymphadenopathy:     Cervical: No cervical adenopathy.  Skin:    Capillary Refill: Capillary refill takes less  than 2 seconds.     Coloration: Skin is not jaundiced or pale.  Neurological:     General: No focal deficit present.     Mental Status: He is alert and oriented to person, place, and time.  Psychiatric:        Behavior: Behavior normal.      UC Treatments / Results  Labs (all labs ordered are listed, but only abnormal results are displayed) Labs Reviewed  POCT URINALYSIS DIP (MANUAL ENTRY) - Abnormal; Notable for the following components:      Result Value   Glucose, UA >=1,000 (*)    All other components within normal limits  POCT FASTING CBG [facility] MANUAL ENTRY - Abnormal; Notable for the following components:   POCT Glucose ([facility]) 498 (*)    All other components within normal limits  CBC WITH DIFFERENTIAL/PLATELET  COMPREHENSIVE METABOLIC PANEL  TSH  HIV ANTIBODY (ROUTINE TESTING W REFLEX)    EKG   Radiology No results found.  Procedures Procedures (including critical care time)  Medications Ordered in UC Medications - No data to display  Initial Impression / Assessment and Plan / UC Course  I have reviewed the triage vital signs and the nursing notes.  Pertinent labs & imaging results that were available during my care of the patient were reviewed by me and considered in my medical decision making (see chart for details).     Urinalysis shows greater than the 1000 mg percent of sugar in the urine.  There are no nitrites, leuks, or red blood cells.  Fingerstick glucose was 498.  Overall vital signs are reassuring as he is not tachycardic and his blood pressure is normal.  Metformin is sent in to begin treating his diabetes.  Staff will make him a primary care appointment for sometime soon. CBC and chemistry panel and TSH are drawn today due to the weight loss.  Staff will notify them if anything is significantly abnormal.  Sister also requests HIV to be drawn.  That is added on to be drawn  Final Clinical Impressions(s) / UC Diagnoses   Final diagnoses:   Diabetes mellitus due to underlying condition with hyperglycemia, without long-term current  use of insulin (HCC)  Loss of weight  Urinary frequency     Discharge Instructions      Your urine had a lot of sugar on it.  It had no sign of urinary infection on it.  Your sugar today was 498.  This is very high.  Uncontrolled diabetes very well could be causing a lot of your symptoms, including the weight loss and feeling tired.  Begin taking metformin 500 mg twice a day with breakfast and supper.  Most likely, you are going to need more medications than just 1 to control your sugar adequately.  We have drawn blood to check your blood counts, kidney and liver function numbers and electrolytes, and thyroid.  You can use the QR code/website at the back of the summary paperwork to schedule yourself a new patient appointment with primary care      ED Prescriptions     Medication Sig Dispense Auth. Provider   metFORMIN (GLUCOPHAGE) 500 MG tablet Take 1 tablet (500 mg total) by mouth 2 (two) times daily with a meal. 60 tablet Avarey Yaeger, Janace Aris, MD      PDMP not reviewed this encounter.   Zenia Resides, MD 08/22/23 1312    Zenia Resides, MD 08/22/23 1312    Zenia Resides, MD 08/22/23 1318

## 2023-08-22 NOTE — Discharge Instructions (Addendum)
Your urine had a lot of sugar on it.  It had no sign of urinary infection on it.  Your sugar today was 498.  This is very high.  Uncontrolled diabetes very well could be causing a lot of your symptoms, including the weight loss and feeling tired.  Begin taking metformin 500 mg twice a day with breakfast and supper.  Most likely, you are going to need more medications than just 1 to control your sugar adequately.  We have drawn blood to check your blood counts, kidney and liver function numbers and electrolytes, and thyroid.  You can use the QR code/website at the back of the summary paperwork to schedule yourself a new patient appointment with primary care

## 2023-08-23 LAB — HIV ANTIBODY (ROUTINE TESTING W REFLEX): HIV Screen 4th Generation wRfx: NONREACTIVE

## 2023-09-03 ENCOUNTER — Encounter: Payer: Self-pay | Admitting: Family

## 2023-09-03 ENCOUNTER — Ambulatory Visit (INDEPENDENT_AMBULATORY_CARE_PROVIDER_SITE_OTHER): Payer: Commercial Managed Care - PPO | Admitting: Family

## 2023-09-03 VITALS — BP 110/60 | HR 80 | Temp 97.5°F | Resp 20 | Ht 68.0 in | Wt 167.0 lb

## 2023-09-03 DIAGNOSIS — Z1159 Encounter for screening for other viral diseases: Secondary | ICD-10-CM | POA: Diagnosis not present

## 2023-09-03 DIAGNOSIS — I10 Essential (primary) hypertension: Secondary | ICD-10-CM

## 2023-09-03 DIAGNOSIS — R197 Diarrhea, unspecified: Secondary | ICD-10-CM | POA: Diagnosis not present

## 2023-09-03 DIAGNOSIS — F17209 Nicotine dependence, unspecified, with unspecified nicotine-induced disorders: Secondary | ICD-10-CM

## 2023-09-03 DIAGNOSIS — Z125 Encounter for screening for malignant neoplasm of prostate: Secondary | ICD-10-CM

## 2023-09-03 DIAGNOSIS — F1090 Alcohol use, unspecified, uncomplicated: Secondary | ICD-10-CM

## 2023-09-03 DIAGNOSIS — M25512 Pain in left shoulder: Secondary | ICD-10-CM

## 2023-09-03 DIAGNOSIS — E1165 Type 2 diabetes mellitus with hyperglycemia: Secondary | ICD-10-CM | POA: Diagnosis not present

## 2023-09-03 DIAGNOSIS — G8929 Other chronic pain: Secondary | ICD-10-CM

## 2023-09-03 DIAGNOSIS — H538 Other visual disturbances: Secondary | ICD-10-CM

## 2023-09-03 DIAGNOSIS — Z789 Other specified health status: Secondary | ICD-10-CM

## 2023-09-03 DIAGNOSIS — R351 Nocturia: Secondary | ICD-10-CM

## 2023-09-03 LAB — GLUCOSE, POCT (MANUAL RESULT ENTRY): POC Glucose: 409 mg/dL — AB (ref 70–99)

## 2023-09-03 MED ORDER — LANCET DEVICE MISC
1.0000 | Freq: Three times a day (TID) | 0 refills | Status: AC
Start: 2023-09-03 — End: 2023-10-03

## 2023-09-03 MED ORDER — ATORVASTATIN CALCIUM 10 MG PO TABS: 10.0000 mg | ORAL_TABLET | Freq: Every day | ORAL | 3 refills | Status: DC

## 2023-09-03 MED ORDER — LANCETS MISC. MISC
1.0000 | Freq: Three times a day (TID) | 4 refills | Status: DC
Start: 2023-09-03 — End: 2023-09-17

## 2023-09-03 MED ORDER — BLOOD GLUCOSE TEST VI STRP
1.0000 | ORAL_STRIP | Freq: Three times a day (TID) | 4 refills | Status: DC
Start: 2023-09-03 — End: 2023-09-17

## 2023-09-03 MED ORDER — BLOOD GLUCOSE MONITORING SUPPL DEVI
1.0000 | Freq: Three times a day (TID) | 0 refills | Status: DC
Start: 2023-09-03 — End: 2023-09-17

## 2023-09-03 NOTE — Patient Instructions (Signed)
-   Please check blood sugar daily and record on provided log.please bring log in 2 weeks to evaluate blood sugars.

## 2023-09-03 NOTE — Progress Notes (Signed)
 Provider: Roxan Plough FNP-C   Patient, No Pcp Per  Patient Care Team: Patient, No Pcp Per as PCP - General (General Practice)  Extended Emergency Contact Information Primary Emergency Contact: Vitrano,Garnette Mobile Phone: [phone] Relation: Sister  Code Status:  Full Code  Goals of care: Advanced Directive information    01/25/2022    5:51 PM  Advanced Directives  Does Patient Have a Medical Advance Directive? No  Would patient like information on creating a medical advance directive? No - Patient declined     Chief Complaint  Patient presents with   New Patient (Initial Visit)    Patient presents today for a new patient appointment.    HPI:  Pt is a 48 y.o. male seen today establish care here at [facility] for medical management of chronic diseases.Here with sister Fermin Yan.Has a medical history of Type 2 Diabetes mellitus, Essential hypertension, alcohol use disorder, cigarette smoker Among Others. He was seen in the ED on 08/02/2023 for high blood sugar,Tiredness and weight loss .also reported urine frequency and requested prostate to be checked had hard time walking and felt swollen.U/a showed > 1000 mg of sugar but negative for infection.His metformin  was reordered had not taken since it was ordered in August,2024.   Diarrhea - Has had up to 5 times diarrhea daily  for the past 2 to 3 months with increased flatulence and abdominal cramping prior to bowel movements. Also report rectal/anal pressure with feelings of moving bowels despite no BM. He denies any hemorrhoids ,dark or blood in the stool.also denies any nausea,vomiting,bloating or abdominal pain.   Type 2 DM - was told had diabetes 6 yrs ago but was not taking medication. Has not seen [facility].at of vision being blurry worst on the left eye. He denies any numbness or tingling on extremities except reports numbness on left thumb and index finger though seems to be related  with left shoulder and neck pain.discussed referral to orthopedic.    Hypertension - Has headache every once a while.Has had a blurry vision sometimes on left eye.He denies any headache,dizziness,fatigue,chest tightness,palpitation,chest pain or shortness of breath.       Smokers cigarettes 1/2 pack per day since he was 48 yrs old.   Past Medical History:  Diagnosis Date   Diabetes mellitus without complication (HCC)    Hypertension    History reviewed. No pertinent surgical history.  No Known Allergies  Allergies as of 09/03/2023   No Known Allergies      Medication List        Accurate as of September 03, 2023 10:37 AM. If you have any questions, ask your nurse or doctor.          metFORMIN  500 MG tablet Commonly known as: GLUCOPHAGE  Take 1 tablet (500 mg total) by mouth 2 (two) times daily with a meal.        Review of Systems  Constitutional:  Negative for appetite change, chills, fatigue, fever and unexpected weight change.  HENT:  Negative for congestion, dental problem, ear discharge, ear pain, facial swelling, hearing loss, nosebleeds, postnasal drip, rhinorrhea, sinus pressure, sinus pain, sneezing, sore throat, tinnitus and trouble swallowing.   Eyes:  Positive for visual disturbance. Negative for pain, discharge, redness and itching.       Left eye blurry   Respiratory:  Negative for cough, chest tightness, shortness of breath and wheezing.   Cardiovascular:  Negative for chest pain, palpitations and leg swelling.  Gastrointestinal:  Positive for diarrhea. Negative for abdominal distention, abdominal pain, blood in stool, constipation, nausea and vomiting.       Increase gas   Endocrine: Negative for cold intolerance, heat intolerance, polydipsia, polyphagia and polyuria.  Genitourinary:  Positive for frequency. Negative for difficulty urinating, dysuria, flank pain and urgency.       Voids up to 7 times during the night  Musculoskeletal:  Positive for  arthralgias. Negative for back pain, gait problem, joint swelling, myalgias, neck pain and neck stiffness.       Left shoulder pain with numbness and tingling of thumb and index finger   Skin:  Negative for color change, pallor, rash and wound.  Neurological:  Negative for dizziness, syncope, speech difficulty, weakness, light-headedness and headaches.       Left thumb and index finger numbness   Hematological:  Does not bruise/bleed easily.  Psychiatric/Behavioral:  Negative for agitation, behavioral problems, confusion, hallucinations, self-injury, sleep disturbance and suicidal ideas. The patient is not nervous/anxious.     There is no immunization history for the selected administration types on file for this patient. Pertinent  Health Maintenance Due  Topic Date Due   Colonoscopy  Never done   INFLUENZA VACCINE  Never done      01/25/2022    5:51 PM  Fall Risk  (RETIRED) Patient Fall Risk Level Low fall risk   Functional Status Survey:    Vitals:   09/03/23 1021  BP: 110/60  Pulse: 80  Resp: 20  Temp: (!) 97.5 F (36.4 C)  SpO2: 99%  Weight: 167 lb (75.8 kg)  Height: 5' 8 (1.727 m)   Body mass index is 25.39 kg/m. Physical Exam Vitals reviewed.  Constitutional:      General: He is not in acute distress.    Appearance: Normal appearance. He is overweight. He is not ill-appearing or diaphoretic.  HENT:     Head: Normocephalic.     Right Ear: Tympanic membrane, ear canal and external ear normal. There is no impacted cerumen.     Left Ear: Tympanic membrane, ear canal and external ear normal. There is no impacted cerumen.     Nose: Nose normal. No congestion or rhinorrhea.     Mouth/Throat:     Mouth: Mucous membranes are moist.     Pharynx: Oropharynx is clear. No oropharyngeal exudate or posterior oropharyngeal erythema.  Eyes:     General: No scleral icterus.       Right eye: No discharge.        Left eye: No discharge.     Extraocular Movements: Extraocular  movements intact.     Conjunctiva/sclera: Conjunctivae normal.     Pupils: Pupils are equal, round, and reactive to light.  Neck:     Vascular: No carotid bruit.  Cardiovascular:     Rate and Rhythm: Normal rate and regular rhythm.     Pulses: Normal pulses.     Heart sounds: Normal heart sounds. No murmur heard.    No friction rub. No gallop.  Pulmonary:     Effort: Pulmonary effort is normal. No respiratory distress.     Breath sounds: Normal breath sounds. No wheezing, rhonchi or rales.  Chest:     Chest wall: No tenderness.  Abdominal:     General: Bowel sounds are normal. There is no distension.     Palpations: Abdomen is soft. There is no mass.     Tenderness: There is no abdominal tenderness. There is no right CVA tenderness, left  CVA tenderness, guarding or rebound.  Musculoskeletal:        General: No swelling or tenderness. Normal range of motion.     Cervical back: Normal range of motion. No rigidity or tenderness.     Right lower leg: No edema.     Left lower leg: No edema.  Lymphadenopathy:     Cervical: No cervical adenopathy.  Skin:    General: Skin is warm and dry.     Coloration: Skin is not pale.     Findings: No bruising, erythema, lesion or rash.  Neurological:     Mental Status: He is alert and oriented to person, place, and time.     Cranial Nerves: No cranial nerve deficit.     Sensory: No sensory deficit.     Motor: No weakness.     Coordination: Coordination normal.     Gait: Gait normal.  Psychiatric:        Mood and Affect: Mood normal.        Speech: Speech normal.        Behavior: Behavior normal.        Thought Content: Thought content normal.        Judgment: Judgment normal.     Labs reviewed: Recent Labs    04/20/23 0011 08/22/23 1309  NA 129* 130*  K 3.7 4.1  CL 97* 93*  CO2 26 26  GLUCOSE 472* 501*  BUN 11 5*  CREATININE 0.72 0.75  CALCIUM  8.5* 8.7*   Recent Labs    08/22/23 1309  AST 260*  ALT 136*  ALKPHOS 91   BILITOT 1.4*  PROT 5.7*  ALBUMIN 3.5   Recent Labs    04/20/23 0011 08/22/23 1309  WBC 5.6 7.3  NEUTROABS 2.6 3.9  HGB 14.0 14.9  HCT 42.2 43.5  MCV 90.2 89.7  PLT 220 213   Lab Results  Component Value Date   TSH 0.608 08/22/2023   No results found for: HGBA1C No results found for: CHOL, HDL, LDLCALC, LDLDIRECT, TRIG, CHOLHDL  Significant Diagnostic Results in last 30 days:  No results found.  Assessment/Plan 1. Type 2 diabetes mellitus with hyperglycemia, without long-term current use of insulin  (HCC) (Primary) -No recent hemoglobin A1c for review -Will order glucometer and supplies advised to check blood sugars daily and record on provided log and bring log in 2 weeks for evaluation of blood sugars -Continue on metformin  -Will start on atorvastatin  and aspirin  -Dietary modification and exercise advised -Will refer to ophthalmologist for annual eye exam and evaluation of left eye blurry vision. - POC Glucose (CBG) - Lipid panel; Future - COMPLETE METABOLIC PANEL WITH GFR; Future - CBC with Differential/Platelet; Future - Hemoglobin A1c; Future - Microalbumin / creatinine urine ratio - Ambulatory referral to Ophthalmology - Blood Glucose Monitoring Suppl DEVI; 1 each by Does not apply route in the morning, at noon, and at bedtime. May substitute to any manufacturer covered by patient's insurance.  Dispense: 1 each; Refill: 0 - Glucose Blood (BLOOD GLUCOSE TEST STRIPS) STRP; 1 each by In Vitro route in the morning, at noon, and at bedtime. May substitute to any manufacturer covered by patient's insurance.  Dispense: 100 strip; Refill: 4 - Lancet Device MISC; 1 each by Does not apply route in the morning, at noon, and at bedtime. May substitute to any manufacturer covered by patient's insurance.  Dispense: 1 each; Refill: 0 - Lancets Misc. MISC; 1 each by Does not apply route in the morning, at noon, and at  bedtime. May substitute to any manufacturer covered  by patient's insurance.  Dispense: 100 each; Refill: 4  2. Essential hypertension Blood pressure well-controlled -Will check lab work then start on ACE inhibitor or an ARB for renal protection -Dietary modification and exercise discussed at length  3. Diarrhea, unspecified type Has had up to 5 times diarrhea for the past 2 to 3 months with increased flatulence and abdominal cramping prior to bowel movements. Also report rectal/anal pressure with feelings of moving bowels despite no BM.  - Ambulatory referral to Gastroenterology - Ova and Parasite Exam - Stool culture  4. Encounter for hepatitis C screening test for low risk patient Low risk  - Hepatitis C antibody; Future  5. Prostate cancer screening Reports voiding up to 7 times during the night  though could be related to uncontrolled blood sugars but also concerning with reports of rectal anal pressure with sensation of emptying his bowels even after bowels have moved.  - PSA, Total and Free; Future  6. Nocturia X 7 times voiding at night as above  - PSA, Total and Free; Future  7. Blurry vision, left eye Suspect due to uncontrolled blood sugars.recently restarted metformin   - Ambulatory referral to Ophthalmology  8. Chronic left shoulder pain Worsening with numbness on left thumb and index finger.  - Ambulatory referral to Orthopedic Surgery  9. Tobacco use disorder  Smokes one and a half pack of cigarettes per day since age 63 yrs old Smoking cessation advised will notify provider if ready to quit then start on Wellbutrin   10. Alcohol use  Report has cut down to one daily  Alcohol use cessation advised.  Family/ staff Communication: Reviewed plan of care with patient and sister verbalize understanding   Labs/tests ordered:  - POC Glucose (CBG) - Lipid panel; Future - COMPLETE METABOLIC PANEL WITH GFR; Future - CBC with Differential/Platelet; Future - Hemoglobin A1c; Future - Microalbumin / creatinine urine  ratio - Hepatitis C antibody; Future - PSA, Total and Free; Future - Ova and Parasite Exam - Stool culture  Next Appointment : Return in about 2 weeks (around 09/17/2023) for Type 2 diabetes mellitus. Fasting labs in one week.   Roxan JAYSON Plough, NP

## 2023-09-04 LAB — MICROALBUMIN / CREATININE URINE RATIO
Creatinine, Urine: 19 mg/dL — ABNORMAL LOW (ref 20–320)
Microalb Creat Ratio: 26 mg/g{creat} (ref <30)
Microalb, Ur: 0.5 mg/dL

## 2023-09-10 ENCOUNTER — Other Ambulatory Visit: Payer: Commercial Managed Care - PPO

## 2023-09-10 ENCOUNTER — Other Ambulatory Visit: Payer: Self-pay | Admitting: Family

## 2023-09-10 DIAGNOSIS — R197 Diarrhea, unspecified: Secondary | ICD-10-CM

## 2023-09-13 LAB — SALMONELLA AND SHIGELLA,CULTURE
MICRO NUMBER:: 15972552
SPECIMEN QUALITY:: ADEQUATE

## 2023-09-13 LAB — OVA AND PARASITE EXAMINATION
CONCENTRATE RESULT:: NONE SEEN
MICRO NUMBER:: 15970883
SPECIMEN QUALITY:: ADEQUATE
TRICHROME RESULT:: NONE SEEN

## 2023-09-17 ENCOUNTER — Ambulatory Visit: Payer: Commercial Managed Care - PPO | Admitting: Family

## 2023-09-17 ENCOUNTER — Encounter: Payer: Self-pay | Admitting: Family

## 2023-09-17 VITALS — BP 124/78 | HR 64 | Temp 97.0°F | Resp 18 | Ht 68.0 in | Wt 158.6 lb

## 2023-09-17 DIAGNOSIS — E1165 Type 2 diabetes mellitus with hyperglycemia: Secondary | ICD-10-CM

## 2023-09-17 DIAGNOSIS — M25512 Pain in left shoulder: Secondary | ICD-10-CM

## 2023-09-17 DIAGNOSIS — R0981 Nasal congestion: Secondary | ICD-10-CM

## 2023-09-17 DIAGNOSIS — E785 Hyperlipidemia, unspecified: Secondary | ICD-10-CM

## 2023-09-17 DIAGNOSIS — R197 Diarrhea, unspecified: Secondary | ICD-10-CM | POA: Diagnosis not present

## 2023-09-17 DIAGNOSIS — Z139 Encounter for screening, unspecified: Secondary | ICD-10-CM

## 2023-09-17 DIAGNOSIS — Z789 Other specified health status: Secondary | ICD-10-CM

## 2023-09-17 DIAGNOSIS — I10 Essential (primary) hypertension: Secondary | ICD-10-CM

## 2023-09-17 DIAGNOSIS — G8929 Other chronic pain: Secondary | ICD-10-CM

## 2023-09-17 DIAGNOSIS — F1090 Alcohol use, unspecified, uncomplicated: Secondary | ICD-10-CM

## 2023-09-17 DIAGNOSIS — F17209 Nicotine dependence, unspecified, with unspecified nicotine-induced disorders: Secondary | ICD-10-CM

## 2023-09-17 DIAGNOSIS — Z23 Encounter for immunization: Secondary | ICD-10-CM

## 2023-09-17 DIAGNOSIS — Z125 Encounter for screening for malignant neoplasm of prostate: Secondary | ICD-10-CM

## 2023-09-17 DIAGNOSIS — Z1159 Encounter for screening for other viral diseases: Secondary | ICD-10-CM

## 2023-09-17 DIAGNOSIS — R200 Anesthesia of skin: Secondary | ICD-10-CM

## 2023-09-17 MED ORDER — ASPIRIN 81 MG PO TBEC
81.0000 mg | DELAYED_RELEASE_TABLET | Freq: Every day | ORAL | 5 refills | Status: AC
Start: 2023-09-17 — End: ?

## 2023-09-17 MED ORDER — BLOOD GLUCOSE TEST VI STRP
1.0000 | ORAL_STRIP | Freq: Three times a day (TID) | 4 refills | Status: AC
Start: 2023-09-17 — End: ?

## 2023-09-17 MED ORDER — FLUTICASONE PROPIONATE 50 MCG/ACT NA SUSP: 2.0000 | Freq: Every day | NASAL | 6 refills | Status: AC

## 2023-09-17 MED ORDER — BLOOD GLUCOSE MONITORING SUPPL DEVI
1.0000 | Freq: Three times a day (TID) | 0 refills | Status: DC
Start: 2023-09-17 — End: 2024-03-16

## 2023-09-17 MED ORDER — ATORVASTATIN CALCIUM 10 MG PO TABS
10.0000 mg | ORAL_TABLET | Freq: Every day | ORAL | 3 refills | Status: DC
Start: 2023-09-17 — End: 2024-07-18

## 2023-09-17 MED ORDER — LANCETS MISC. MISC: 1.0000 | Freq: Three times a day (TID) | 4 refills | Status: AC

## 2023-09-17 NOTE — Progress Notes (Unsigned)
Provider: Richarda Blade FNP-C   Jeanine Caven, Donalee Citrin, NP  Patient Care Team: Darden Flemister, Donalee Citrin, NP as PCP - General (Family Medicine)  Extended Emergency Contact Information Primary Emergency Contact: Flaim,Garnette Mobile Phone: [phone] Relation: Sister  Code Status:  Full Code  Goals of care: Advanced Directive information    09/17/2023   10:49 AM  Advanced Directives  Does Patient Have a Medical Advance Directive? No  Would patient like information on creating a medical advance directive? No - Patient declined     Chief Complaint  Patient presents with  . Medical Management of Chronic Issues    Fasting labs and see provider 2 weeks   . Immunizations    Pneumonia, DTAP, Influenza and Covid  . Health Maintenance    Eye exam, Hepatitis C screening and Colonoscopy.  . Labs Only    Hemoglobin A1C    HPI:  Pt is a 48 y.o. male seen today for medical management of chronic diseases.     Past Medical History:  Diagnosis Date  . Diabetes mellitus without complication (HCC)   . Hypertension    History reviewed. No pertinent surgical history.  No Known Allergies  Allergies as of 09/17/2023   No Known Allergies      Medication List        Accurate as of September 17, 2023 11:25 AM. If you have any questions, ask your nurse or doctor.          aspirin EC 81 MG tablet Take 1 tablet (81 mg total) by mouth daily. Swallow whole.   atorvastatin 10 MG tablet Commonly known as: LIPITOR Take 1 tablet (10 mg total) by mouth daily.   Blood Glucose Monitoring Suppl Devi 1 each by Does not apply route in the morning, at noon, and at bedtime. May substitute to any manufacturer covered by patient's insurance.   BLOOD GLUCOSE TEST STRIPS Strp 1 each by In Vitro route in the morning, at noon, and at bedtime. May substitute to any manufacturer covered by patient's insurance.   Lancet Device Misc 1 each by Does not apply route in the morning, at noon, and at bedtime.  May substitute to any manufacturer covered by patient's insurance.   Lancets Misc. Misc 1 each by Does not apply route in the morning, at noon, and at bedtime. May substitute to any manufacturer covered by patient's insurance.   metFORMIN 500 MG tablet Commonly known as: GLUCOPHAGE Take 1 tablet (500 mg total) by mouth 2 (two) times daily with a meal.        Review of Systems  Constitutional:  Negative for appetite change, chills, fatigue, fever and unexpected weight change.  HENT:  Negative for congestion, dental problem, ear discharge, ear pain, facial swelling, hearing loss, nosebleeds, postnasal drip, rhinorrhea, sinus pressure, sinus pain, sneezing, sore throat, tinnitus and trouble swallowing.   Eyes:  Negative for pain, discharge, redness, itching and visual disturbance.  Respiratory:  Negative for cough, chest tightness, shortness of breath and wheezing.   Cardiovascular:  Negative for chest pain, palpitations and leg swelling.  Gastrointestinal:  Positive for diarrhea. Negative for abdominal distention, abdominal pain, blood in stool, constipation, nausea and vomiting.  Endocrine: Negative for cold intolerance, heat intolerance, polydipsia, polyphagia and polyuria.  Genitourinary:  Positive for frequency. Negative for difficulty urinating, dysuria, flank pain and urgency.       Voids x 7 times at night   Musculoskeletal:  Negative for arthralgias, back pain, gait problem, joint swelling, myalgias, neck pain  and neck stiffness.  Skin:  Negative for color change, pallor, rash and wound.  Neurological:  Positive for numbness. Negative for dizziness, syncope, speech difficulty, weakness, light-headedness and headaches.       Left Thumb,index and middle finger and right Thumb and index finger numbness   Hematological:  Does not bruise/bleed easily.  Psychiatric/Behavioral:  Negative for agitation, behavioral problems, confusion, hallucinations, self-injury, sleep disturbance and  suicidal ideas. The patient is not nervous/anxious.     There is no immunization history for the selected administration types on file for this patient. Pertinent  Health Maintenance Due  Topic Date Due  . HEMOGLOBIN A1C  Never done  . OPHTHALMOLOGY EXAM  Never done  . Colonoscopy  Never done  . INFLUENZA VACCINE  11/22/2023 (Originally 03/25/2023)  . FOOT EXAM  09/02/2024      01/25/2022    5:51 PM 09/17/2023   10:50 AM  Fall Risk  Falls in the past year?  0  Was there an injury with Fall?  0  Fall Risk Category Calculator  0  (RETIRED) Patient Fall Risk Level Low fall risk   Patient at Risk for Falls Due to  No Fall Risks  Fall risk Follow up  Falls evaluation completed   Functional Status Survey:    Vitals:   09/17/23 1046  BP: 124/78  Pulse: 64  Resp: 18  Temp: (!) 97 F (36.1 C)  SpO2: 95%  Weight: 158 lb 9.6 oz (71.9 kg)  Height: 5\' 8"  (1.727 m)   Body mass index is 24.12 kg/m. Physical Exam  Labs reviewed: Recent Labs    04/20/23 0011 08/22/23 1309  NA 129* 130*  K 3.7 4.1  CL 97* 93*  CO2 26 26  GLUCOSE 472* 501*  BUN 11 5*  CREATININE 0.72 0.75  CALCIUM 8.5* 8.7*   Recent Labs    08/22/23 1309  AST 260*  ALT 136*  ALKPHOS 91  BILITOT 1.4*  PROT 5.7*  ALBUMIN 3.5   Recent Labs    04/20/23 0011 08/22/23 1309  WBC 5.6 7.3  NEUTROABS 2.6 3.9  HGB 14.0 14.9  HCT Karl.2 43.5  MCV 90.2 89.7  PLT 220 213   Lab Results  Component Value Date   TSH 0.608 08/22/2023   No results found for: "HGBA1C" No results found for: "CHOL", "HDL", "LDLCALC", "LDLDIRECT", "TRIG", "CHOLHDL"  Significant Diagnostic Results in last 30 days:  No results found.  Assessment/Plan 1. Type 2 diabetes mellitus with hyperglycemia, without long-term current use of insulin (HCC) (Primary) *** - Hemoglobin A1c - Ambulatory referral to Ophthalmology - Blood Glucose Monitoring Suppl DEVI; 1 each by Does not apply route in the morning, at noon, and at bedtime. May  substitute to any manufacturer covered by patient's insurance.  Dispense: 1 each; Refill: 0 - Glucose Blood (BLOOD GLUCOSE TEST STRIPS) STRP; 1 each by In Vitro route in the morning, at noon, and at bedtime. May substitute to any manufacturer covered by patient's insurance.  Dispense: 100 strip; Refill: 4 - Lancets Misc. MISC; 1 each by Does not apply route in the morning, at noon, and at bedtime. May substitute to any manufacturer covered by patient's insurance.  Dispense: 100 each; Refill: 4  2. Essential hypertension *** - aspirin EC 81 MG tablet; Take 1 tablet (81 mg total) by mouth daily. Swallow whole.  Dispense: 30 tablet; Refill: 5  3. Diarrhea, unspecified type ***  4. Chronic left shoulder pain ***  5. Tobacco use disorder, continuous ***  6. Alcohol use ***  7. Hyperlipidemia LDL goal <100 *** - atorvastatin (LIPITOR) 10 MG tablet; Take 1 tablet (10 mg total) by mouth daily.  Dispense: 90 tablet; Refill: 3  8. Encounter for hepatitis C screening test for low risk patient *** - Hepatitis C antibody  9. Encounter for screening involving social determinants of health (SDoH) *** - Ambulatory referral to Social Work    Family/ staff Communication: Reviewed plan of care with patient  Labs/tests ordered: None   Next Appointment :   Caesar Bookman, NP

## 2023-09-17 NOTE — Patient Instructions (Signed)
Adult nurse [facility] [address] [place], [place]    Main [place]: [phone] Fax: [phone]

## 2023-09-19 DIAGNOSIS — R200 Anesthesia of skin: Secondary | ICD-10-CM | POA: Insufficient documentation

## 2023-09-19 DIAGNOSIS — E785 Hyperlipidemia, unspecified: Secondary | ICD-10-CM | POA: Insufficient documentation

## 2023-09-20 ENCOUNTER — Telehealth: Payer: Self-pay | Admitting: *Deleted

## 2023-09-20 LAB — HEPATITIS C ANTIBODY: Hepatitis C Ab: NONREACTIVE

## 2023-09-20 LAB — PSA, TOTAL AND FREE
PSA, % Free: 17 % — ABNORMAL LOW (ref >25)
PSA, Free: 0.1 ng/mL
PSA, Total: 0.6 ng/mL (ref <=4.0)

## 2023-09-20 LAB — HEMOGLOBIN A1C: Hgb A1c MFr Bld: >14 %{Hb} — ABNORMAL HIGH (ref <5.7)

## 2023-09-20 NOTE — Progress Notes (Signed)
Complex Care Management Note Care Guide Note  09/20/2023 Name: Karl Torres MRN: [id number] DOB: Feb 11, 1976   Complex Care Management Outreach Attempts: An unsuccessful telephone outreach was attempted today to offer the patient information about available complex care management services.  Follow Up Plan:  Additional outreach attempts will be made to offer the patient complex care management information and services.   Encounter Outcome:  No Answer  Gwenevere Ghazi  [facility]  [facility], [facility] Guide  Direct Dial: [phone]  Fax [phone]

## 2023-09-21 NOTE — Progress Notes (Signed)
Complex Care Management Note Care Guide Note  09/21/2023 Name: Karl Torres MRN: [id number] DOB: Dec 16, 1975   Complex Care Management Outreach Attempts: A second unsuccessful outreach was attempted today to offer the patient with information about available complex care management services.  Follow Up Plan:  Additional outreach attempts will be made to offer the patient complex care management information and services.   Encounter Outcome:  No Answer  Gwenevere Ghazi  [facility]  [facility], [facility] Guide  Direct Dial: [phone]  Fax [phone]

## 2023-09-24 NOTE — Progress Notes (Signed)
Complex Care Management Note  Care Guide Note 09/24/2023 Name: Karl Torres MRN: [id number] DOB: 05-21-1976  Karl Torres is a 48 y.o. year old male who sees [name], [name], NP for primary care. I reached out to Lenard Galloway by phone today to offer complex care management services.  [name] was given information about Complex Care Management services today including:   The Complex Care Management services include support from the care team which includes your Nurse Coordinator, Clinical Social Worker, or Pharmacist.  The Complex Care Management team is here to help remove barriers to the health concerns and goals most important to you. Complex Care Management services are voluntary, and the patient may decline or stop services at any time by request to their care team member.   Complex Care Management Consent Status: Patient agreed to services and verbal consent obtained.   Follow up plan:  Telephone appointment with complex care management team member scheduled for:  10/01/23  Encounter Outcome:  Patient Scheduled  Gwenevere Ghazi  [facility]  [facility], [facility] Guide  Direct Dial: [phone]  Fax [phone]

## 2023-09-27 ENCOUNTER — Encounter: Payer: Self-pay | Admitting: Adult Health

## 2023-09-27 ENCOUNTER — Other Ambulatory Visit: Payer: Commercial Managed Care - PPO

## 2023-09-27 ENCOUNTER — Ambulatory Visit (INDEPENDENT_AMBULATORY_CARE_PROVIDER_SITE_OTHER): Payer: Commercial Managed Care - PPO | Admitting: Adult Health

## 2023-09-27 VITALS — BP 127/88 | HR 67 | Temp 97.2°F | Resp 18 | Ht 68.0 in | Wt 168.8 lb

## 2023-09-27 DIAGNOSIS — E1142 Type 2 diabetes mellitus with diabetic polyneuropathy: Secondary | ICD-10-CM

## 2023-09-27 DIAGNOSIS — E1165 Type 2 diabetes mellitus with hyperglycemia: Secondary | ICD-10-CM

## 2023-09-27 DIAGNOSIS — R42 Dizziness and giddiness: Secondary | ICD-10-CM

## 2023-09-27 DIAGNOSIS — E871 Hypo-osmolality and hyponatremia: Secondary | ICD-10-CM

## 2023-09-27 DIAGNOSIS — F17209 Nicotine dependence, unspecified, with unspecified nicotine-induced disorders: Secondary | ICD-10-CM

## 2023-09-27 DIAGNOSIS — R197 Diarrhea, unspecified: Secondary | ICD-10-CM

## 2023-09-27 LAB — GLUCOSE, POCT (MANUAL RESULT ENTRY): POC Glucose: 503 mg/dL — AB (ref 70–99)

## 2023-09-27 MED ORDER — METFORMIN HCL 1000 MG PO TABS: 1000.0000 mg | ORAL_TABLET | Freq: Two times a day (BID) | ORAL | 3 refills | Status: DC

## 2023-09-27 NOTE — Progress Notes (Unsigned)
[facility] clinic  Provider:   Code Status: *** Goals of Care:     09/17/2023   10:49 AM  Advanced Directives  Does Patient Have a Medical Advance Directive? No  Would patient like information on creating a medical advance directive? No - Patient declined     Chief Complaint  Patient presents with   Acute Visit    Woke up disoriented and dizzy    HPI: Patient is a 48 y.o. male seen today for an acute visit for  Past Medical History:  Diagnosis Date   Diabetes mellitus without complication (HCC)    Hypertension     History reviewed. No pertinent surgical history.  No Known Allergies  Outpatient Encounter Medications as of 09/27/2023  Medication Sig   aspirin EC 81 MG tablet Take 1 tablet (81 mg total) by mouth daily. Swallow whole.   atorvastatin (LIPITOR) 10 MG tablet Take 1 tablet (10 mg total) by mouth daily.   Blood Glucose Monitoring Suppl DEVI 1 each by Does not apply route in the morning, at noon, and at bedtime. May substitute to any manufacturer covered by patient's insurance.   fluticasone (FLONASE) 50 MCG/ACT nasal spray Place 2 sprays into both nostrils daily.   Glucose Blood (BLOOD GLUCOSE TEST STRIPS) STRP 1 each by In Vitro route in the morning, at noon, and at bedtime. May substitute to any manufacturer covered by patient's insurance.   Lancet Device MISC 1 each by Does not apply route in the morning, at noon, and at bedtime. May substitute to any manufacturer covered by patient's insurance.   Lancets Misc. MISC 1 each by Does not apply route in the morning, at noon, and at bedtime. May substitute to any manufacturer covered by patient's insurance.   metFORMIN (GLUCOPHAGE) 500 MG tablet Take 1 tablet (500 mg total) by mouth 2 (two) times daily with a meal.   No facility-administered encounter medications on file as of 09/27/2023.    Review of Systems:  Review of Systems  Constitutional:  Negative for activity change, appetite change and fever.  HENT:  Negative  for sore throat.   Eyes: Negative.   Cardiovascular:  Negative for chest pain and leg swelling.  Gastrointestinal:  Negative for abdominal distention, diarrhea and vomiting.  Genitourinary:  Negative for dysuria, frequency and urgency.  Skin:  Negative for color change.  Neurological:  Negative for dizziness and headaches.  Psychiatric/Behavioral:  Negative for behavioral problems and sleep disturbance. The patient is not nervous/anxious.     Health Maintenance  Topic Date Due   Pneumococcal Vaccine 71-49 Years old (1 of 2 - PCV) Never done   OPHTHALMOLOGY EXAM  Never done   Colonoscopy  Never done   COVID-19 Vaccine (1 - 2024-25 season) Never done   INFLUENZA VACCINE  11/22/2023 (Originally 03/25/2023)   HEMOGLOBIN A1C  03/16/2024   Diabetic kidney evaluation - eGFR measurement  08/21/2024   Diabetic kidney evaluation - Urine ACR  09/02/2024   FOOT EXAM  09/02/2024   DTaP/Tdap/Td (2 - Td or Tdap) 09/16/2033   Hepatitis C Screening  Completed   HIV Screening  Completed   HPV VACCINES  Aged Out    Physical Exam: There were no vitals filed for this visit. There is no height or weight on file to calculate BMI. Physical Exam Constitutional:      Appearance: Normal appearance.  HENT:     Head: Normocephalic and atraumatic.     Mouth/Throat:     Mouth: Mucous membranes are  moist.  Eyes:     Conjunctiva/sclera: Conjunctivae normal.  Cardiovascular:     Rate and Rhythm: Normal rate and regular rhythm.     Pulses: Normal pulses.     Heart sounds: Normal heart sounds.  Pulmonary:     Effort: Pulmonary effort is normal.     Breath sounds: Normal breath sounds.  Abdominal:     General: Bowel sounds are normal.     Palpations: Abdomen is soft.  Musculoskeletal:        General: No swelling. Normal range of motion.     Cervical back: Normal range of motion.  Skin:    General: Skin is warm and dry.  Neurological:     General: No focal deficit present.     Mental Status: He is  alert and oriented to person, place, and time.  Psychiatric:        Mood and Affect: Mood normal.        Behavior: Behavior normal.        Thought Content: Thought content normal.        Judgment: Judgment normal.     Labs reviewed: Basic Metabolic Panel: Recent Labs    04/20/23 0011 08/22/23 1309  NA 129* 130*  K 3.7 4.1  CL 97* 93*  CO2 26 26  GLUCOSE 472* 501*  BUN 11 5*  CREATININE 0.72 0.75  CALCIUM 8.5* 8.7*  TSH  --  0.608   Liver Function Tests: Recent Labs    08/22/23 1309  AST 260*  ALT 136*  ALKPHOS 91  BILITOT 1.4*  PROT 5.7*  ALBUMIN 3.5   No results for input(s): "LIPASE", "AMYLASE" in the last 8760 hours. No results for input(s): "AMMONIA" in the last 8760 hours. CBC: Recent Labs    04/20/23 0011 08/22/23 1309  WBC 5.6 7.3  NEUTROABS 2.6 3.9  HGB 14.0 14.9  HCT 42.2 43.5  MCV 90.2 89.7  PLT 220 213   Lipid Panel: No results for input(s): "CHOL", "HDL", "LDLCALC", "TRIG", "CHOLHDL", "LDLDIRECT" in the last 8760 hours. Lab Results  Component Value Date   HGBA1C >14.0 (H) 09/17/2023    Procedures since last visit: No results found.  Assessment/Plan      Labs/tests ordered:  CBC, BMP  Next appt:  03/17/2024

## 2023-09-28 ENCOUNTER — Other Ambulatory Visit: Payer: Self-pay

## 2023-09-28 LAB — BASIC METABOLIC PANEL WITH GFR
BUN: 9 mg/dL (ref 7–25)
CO2: 24 mmol/L (ref 20–32)
Calcium: 8.9 mg/dL (ref 8.6–10.3)
Chloride: 94 mmol/L — ABNORMAL LOW (ref 98–110)
Creat: 0.94 mg/dL (ref 0.60–1.29)
Glucose, Bld: 487 mg/dL — ABNORMAL HIGH (ref 65–139)
Potassium: 4.6 mmol/L (ref 3.5–5.3)
Sodium: 128 mmol/L — ABNORMAL LOW (ref 135–146)
eGFR: 99 mL/min/{1.73_m2} (ref >=60)

## 2023-09-28 LAB — CBC WITH DIFFERENTIAL/PLATELET
Absolute Lymphocytes: 2390 {cells}/uL (ref 850–3900)
Absolute Monocytes: 554 {cells}/uL (ref 200–950)
Basophils Absolute: 50 {cells}/uL (ref 0–200)
Basophils Relative: 0.7 %
Eosinophils Absolute: 79 {cells}/uL (ref 15–500)
Eosinophils Relative: 1.1 %
HCT: 46.4 % (ref 38.5–50.0)
Hemoglobin: 15.9 g/dL (ref 13.2–17.1)
MCH: 31.4 pg (ref 27.0–33.0)
MCHC: 34.3 g/dL (ref 32.0–36.0)
MCV: 91.5 fL (ref 80.0–100.0)
MPV: 10.2 fL (ref 7.5–12.5)
Monocytes Relative: 7.7 %
Neutro Abs: 4126 {cells}/uL (ref 1500–7800)
Neutrophils Relative %: 57.3 %
Platelets: 260 10*3/uL (ref 140–400)
RBC: 5.07 10*6/uL (ref 4.20–5.80)
RDW: 12.4 % (ref 11.0–15.0)
Total Lymphocyte: 33.2 %
WBC: 7.2 10*3/uL (ref 3.8–10.8)

## 2023-10-01 ENCOUNTER — Ambulatory Visit: Payer: Self-pay | Admitting: Licensed Clinical Social Worker

## 2023-10-01 DIAGNOSIS — E1165 Type 2 diabetes mellitus with hyperglycemia: Secondary | ICD-10-CM

## 2023-10-04 NOTE — Patient Outreach (Signed)
  Care Coordination   Initial Visit Note   10/01/2023 Name: Karl Torres MRN: [id number] DOB: [birth year]  Karl Torres is a 48 y.o. year old male who sees [name], [name], NP for primary care. I spoke with  Karl Torres by phone today.  What matters to the patients health and wellness today?  SDOH, Symptom Management    Goals Addressed             This Visit's Progress    Strenghten Support-LCSW   On track    Activities and task to complete in order to accomplish goals.   Keep all upcoming appointments discussed today Continue with compliance of taking medication prescribed by Doctor Implement healthy coping skills discussed to assist with management of symptoms         SDOH assessments and interventions completed:  Yes  SDOH Interventions Today    Flowsheet Row Most Recent Value  SDOH Interventions   Food Insecurity Interventions AMB Referral, Other (Comment)  [Pt utilizes local churches or food pantries]  Housing Interventions AMB Referral  Transportation Interventions Intervention Not Indicated  Utilities Interventions Intervention Not Indicated        Care Coordination Interventions:  Yes, provided  Interventions Today    Flowsheet Row Most Recent Value  Chronic Disease   Chronic disease during today's visit Diabetes  General Interventions   General Interventions Discussed/Reviewed General Interventions Discussed, Programmer, Applications, Doctor Visits  Doctor Visits Discussed/Reviewed Doctor Visits Discussed  Mental Health Interventions   Mental Health Discussed/Reviewed Mental Health Discussed, Coping Strategies  Nutrition Interventions   Nutrition Discussed/Reviewed Nutrition Discussed  Pharmacy Interventions   Pharmacy Dicussed/Reviewed Pharmacy Topics Discussed, Medication Adherence  Safety Interventions   Safety Discussed/Reviewed Safety Discussed       Follow up plan: Follow up call scheduled for 1-2 weeks    Encounter Outcome:  Patient Visit  Completed   [name], LCSW [place]  [facility], [facility] Clinical Social Worker Direct Dial: [phone]  Fax: [phone] Website: [email or web address] 5:10 PM

## 2023-10-04 NOTE — Patient Instructions (Signed)
 Visit Information  Thank you for taking time to visit with me today. Please don't hesitate to contact me if I can be of assistance to you.   Following are the goals we discussed today:   Goals Addressed             This Visit's Progress    Strenghten Support-LCSW   On track    Activities and task to complete in order to accomplish goals.   Keep all upcoming appointments discussed today Continue with compliance of taking medication prescribed by Doctor Implement healthy coping skills discussed to assist with management of symptoms         Our next appointment is by telephone on 2/17 at 12:30 pm  Please call the care guide team at [phone] if you need to cancel or reschedule your appointment.   If you are experiencing a Mental Health or Behavioral Health Crisis or need someone to talk to, please call the Suicide and Crisis Lifeline: 988 call 911   Patient verbalizes understanding of instructions and care plan provided today and agrees to view in MyChart. Active MyChart status and patient understanding of how to access instructions and care plan via MyChart confirmed with patient.     Karl Torres [facility]  [facility], [facility] Clinical Social Worker Direct Dial: [phone]  Fax: [phone] Website: [email or web address] 5:13 PM

## 2023-10-08 ENCOUNTER — Encounter: Payer: Self-pay | Admitting: Family

## 2023-10-08 ENCOUNTER — Encounter: Payer: Commercial Managed Care - PPO | Admitting: Family

## 2023-10-08 ENCOUNTER — Telehealth: Payer: Self-pay | Admitting: *Deleted

## 2023-10-08 NOTE — Progress Notes (Signed)
Complex Care Management Care Guide Note  10/08/2023 Name: Shourya Macpherson MRN: [id number] DOB: 10/01/1975  Garold Sheeler is a 48 y.o. year old male who is a primary care patient of [name], [name], NP and is actively engaged with the care management team. I reached out to Lenard Galloway by phone today to assist with scheduling  with the BSW.  Follow up plan: Unsuccessful telephone outreach attempt made. A HIPAA compliant phone message was left for the patient providing contact information and requesting a return call.  Gwenevere Ghazi  [facility]  [facility], [facility] Guide  Direct Dial: [phone]  Fax [phone]

## 2023-10-10 NOTE — Progress Notes (Signed)
   This encounter was created in error - please disregard. No show

## 2023-10-11 ENCOUNTER — Encounter: Payer: Self-pay | Admitting: Licensed Clinical Social Worker

## 2023-10-11 ENCOUNTER — Telehealth: Payer: Self-pay | Admitting: Licensed Clinical Social Worker

## 2023-10-11 NOTE — Patient Outreach (Signed)
  Care Coordination   10/11/2023 Name: Karl Torres MRN: [id number] DOB: [birth year]   Care Coordination Outreach Attempts:  An unsuccessful outreach was attempted for an appointment today.  Follow Up Plan:  Additional outreach attempts will be made to offer the patient complex care management information and services.   Encounter Outcome:  No Answer   Care Coordination Interventions:  No, not indicated    Jenel Lucks, LCSW [place]  [facility], [facility] Clinical Social Worker Direct Dial: [phone]  Fax: [phone] Website: [email or web address] 12:45 PM

## 2023-10-12 NOTE — Progress Notes (Unsigned)
 Complex Care Management Care Guide Note  10/12/2023 Name: Karl Torres MRN: [id number] DOB: 12/30/1975  Karl Torres is a 48 y.o. year old male who is a primary care patient of [name], [name], NP and is actively engaged with the care management team. I reached out to Karl Torres by phone today to assist with scheduling  with the BSW.  Follow up plan: Unsuccessful telephone outreach attempt made. A HIPAA compliant phone message was left for the patient providing contact information and requesting a return call. Gwenevere Ghazi  [facility]  [facility], [facility] Guide  Direct Dial: [phone]  Fax [phone]

## 2023-10-13 NOTE — Progress Notes (Signed)
 Complex Care Management Care Guide Note  10/13/2023 Name: Karl Torres MRN: [id number] DOB: April 06, 1976  Karl Torres is a 48 y.o. year old male who is a primary care patient of [name], [name], NP and is actively engaged with the care management team. I reached out to Lenard Galloway by phone today to assist with re-scheduling  with the Licensed Clinical Child psychotherapist.  Follow up plan: Telephone appointment with complex care management team member scheduled for:  2/27 and 2/25  Gwenevere Ghazi  [facility]  [facility], [facility] Guide  Direct Dial: [phone]  Fax [phone]

## 2023-10-19 ENCOUNTER — Ambulatory Visit: Payer: Self-pay | Admitting: Licensed Clinical Social Worker

## 2023-10-19 NOTE — Patient Instructions (Signed)
 Visit Information  Thank you for taking time to visit with me today. Please don't hesitate to contact me if I can be of assistance to you.   Following are the goals we discussed today:   Goals Addressed             This Visit's Progress    Strenghten Support-LCSW   On track    Activities and task to complete in order to accomplish goals.   Keep all upcoming appointments discussed today Continue with compliance of taking medication prescribed by Doctor Implement healthy coping skills discussed to assist with management of symptoms         Our next appointment is by telephone on 3/11 at 12:30 PM  Please call the care guide team at [phone] if you need to cancel or reschedule your appointment.   If you are experiencing a Mental Health or Behavioral Health Crisis or need someone to talk to, please call the Suicide and Crisis Lifeline: 988 call 911   Patient verbalizes understanding of instructions and care plan provided today and agrees to view in MyChart. Active MyChart status and patient understanding of how to access instructions and care plan via MyChart confirmed with patient.     Windy Fast [facility]  [facility], [facility] Clinical Social Worker Direct Dial: [phone]  Fax: [phone] Website: [email or web address] 10:06 AM

## 2023-10-19 NOTE — Patient Outreach (Signed)
 Care Coordination   Follow Up Visit Note   10/19/2023 Name: Karl Torres MRN: [id number] DOB: 24-Aug-1976  Karl Torres is a 48 y.o. year old male who sees [name], [name], NP for primary care. I spoke with  Lenard Galloway by phone today.  What matters to the patients health and wellness today?  Community Resources and Stress Management    Goals Addressed             This Visit's Progress    Strenghten Support-LCSW   On track    Activities and task to complete in order to accomplish goals.   Keep all upcoming appointments discussed today Continue with compliance of taking medication prescribed by Doctor Implement healthy coping skills discussed to assist with management of symptoms         SDOH assessments and interventions completed:  No     Care Coordination Interventions:  Yes, provided  Interventions Today    Flowsheet Row Most Recent Value  Chronic Disease   Chronic disease during today's visit Diabetes, Hypertension (HTN)  General Interventions   General Interventions Discussed/Reviewed General Interventions Reviewed, [employer], Doctor Visits, Communication with  Doctor Visits Discussed/Reviewed Doctor Visits Reviewed  Communication with Social Work  [employer] collaborated with [employer] regarding supportive resources]  Mental Health Interventions   Mental Health Discussed/Reviewed Mental Health Reviewed, Coping Strategies  [Validation and encouragement provided. Pt endorsed ongoing financial strain with inability to save to assist with food and housing needs. LCSW assisted with identifying strengths to promote positivity and stress management]  Nutrition Interventions   Nutrition Discussed/Reviewed Nutrition Reviewed, Decreasing sugar intake  [Pt endorses food insecurity, states he isn't provided healthy food options at local pantries or meal offerings]  Pharmacy Interventions   Pharmacy Dicussed/Reviewed Pharmacy Topics Reviewed, Medication Adherence  Safety  Interventions   Safety Discussed/Reviewed Safety Reviewed       Follow up plan: Follow up call scheduled for 1-2 weeks    Encounter Outcome:  Patient Visit Completed   Jenel Lucks, LCSW [place]  [facility], [facility] Clinical Social Worker Direct Dial: [phone]  Fax: [phone] Website: [email or web address] 10:06 AM

## 2023-10-21 ENCOUNTER — Ambulatory Visit: Payer: Self-pay

## 2023-10-21 NOTE — Patient Instructions (Signed)
 Visit Information  Thank you for taking time to visit with me today. Please don't hesitate to contact me if I can be of assistance to you.   Following are the goals we discussed today:  Patient will contact food banks for additional assistance. Patient will contact housing resources for additional assistance.   Our next appointment is by telephone on 10/29/23 at 9am  Please call the care guide team at [phone] if you need to cancel or reschedule your appointment.   If you are experiencing a Mental Health or Behavioral Health Crisis or need someone to talk to, please call 911  Patient verbalizes understanding of instructions and care plan provided today and agrees to view in MyChart. Active MyChart status and patient understanding of how to access instructions and care plan via MyChart confirmed with patient.     Telephone follow up appointment with care management team member scheduled for:10/29/23 at 9am.   Lysle Morales, BSW [place]  [facility], [facility] Social Worker Direct Dial: [phone]  Fax: [phone] Website: [email or web address]

## 2023-10-21 NOTE — Patient Outreach (Signed)
 Care Coordination   Initial Visit Note   10/21/2023 Name: Karl Torres MRN: [id number] DOB: 1975-09-02  Karl Torres is a 48 y.o. year old male who sees [name], [name], NP for primary care. I spoke with  Lenard Galloway by phone today.  What matters to the patients health and wellness today?  Patient wants food and housing resources.  Patient has very little left from paycheck for food.  Patient left previous apartment because he could not afford the rent.  Patient has cooking appliances in the hotel room.  Due to diabetes, he is limited on types of food he can eat.    Goals Addressed             This Visit's Progress    Care Coordination Activities       Interventions Today    Flowsheet Row Most Recent Value  Chronic Disease   Chronic disease during today's visit Diabetes, Hypertension (HTN)  General Interventions   General Interventions Discussed/Reviewed General Interventions Discussed, General Interventions Reviewed, [employer] lives in a hotel & works.Weekly pay is $600.Rent is 352 weekly.SW provided food bank list & Arch[employer] for hot meals.SW provides housing resources.Pt at work & unable to complete assessment.]              SDOH assessments and interventions completed:  No     Care Coordination Interventions:  Yes, provided   Follow up plan: Follow up call scheduled for 10/29/23 at 9am    Encounter Outcome:  Patient Visit Completed

## 2023-10-29 ENCOUNTER — Ambulatory Visit: Payer: Self-pay

## 2023-10-29 NOTE — Patient Instructions (Signed)
 Visit Information  Thank you for taking time to visit with me today. Please don't hesitate to contact me if I can be of assistance to you.   Following are the goals we discussed today:  Patient will visit [employer] for hot meal and food bank. Patient will apply for food stamps. Patient will enroll with [facility] or [facility]   Our next appointment is by telephone on 11/05/23 at 2:30  Please call the care guide team at [phone] if you need to cancel or reschedule your appointment.   If you are experiencing a Mental Health or Behavioral Health Crisis or need someone to talk to, please call 911  Patient verbalizes understanding of instructions and care plan provided today and agrees to view in MyChart. Active MyChart status and patient understanding of how to access instructions and care plan via MyChart confirmed with patient.     Telephone follow up appointment with care management team member scheduled for:11/05/23 at 2:30pm   Lysle Morales, BSW [place]  [facility], [facility] Social Worker Direct Dial: [phone]  Fax: [phone] Website: [email or web address]

## 2023-10-29 NOTE — Patient Outreach (Signed)
 Care Coordination   Follow Up Visit Note   10/29/2023 Name: Karl Torres MRN: [id number] DOB: 28-Jun-1976  Karl Torres is a 48 y.o. year old male who sees [name], [name], NP for primary care. I spoke with  Karl Torres by phone today.  What matters to the patients health and wellness today?  Patient has only contacted a few food banks but has not received assistance.  Patient admits his depression makes it difficult to move forward with doing the things he needs to do.  Patient does have a plan to accomplish 2 things by next week (applying for housing programs and foodstamps).    Goals Addressed             This Visit's Progress    Care Coordination Activities       Interventions Today    Flowsheet Row Most Recent Value  Chronic Disease   Chronic disease during today's visit Diabetes, Hypertension (HTN)  General Interventions   General Interventions Discussed/Reviewed General Interventions Discussed, General Interventions Reviewed, [employer] has not received food assistance.Pt will go to [employer] today.SW referred pt to [facility] to enroll.Pt has not applied for foodstamps.]  Doctor Visits Discussed/Reviewed Specialist              SDOH assessments and interventions completed:  No     Care Coordination Interventions:  Yes, provided   Follow up plan: Follow up call scheduled for 11/05/23 at 2:30pm    Encounter Outcome:  Patient Visit Completed

## 2023-11-02 ENCOUNTER — Ambulatory Visit: Payer: Self-pay | Admitting: Licensed Clinical Social Worker

## 2023-11-02 NOTE — Patient Instructions (Signed)
 Visit Information  Thank you for taking time to visit with me today. Please don't hesitate to contact me if I can be of assistance to you.   Following are the goals we discussed today:   Goals Addressed             This Visit's Progress    Strenghten Support-LCSW       Activities and task to complete in order to accomplish goals.   Keep all upcoming appointments discussed today Continue with compliance of taking medication prescribed by Doctor Implement healthy coping skills discussed to assist with management of symptoms Follow up with DSS to apply for SNAP benefits. Continue to obtain meals/goods from [employer]         Our next appointment is by telephone on 2/25 at 12:20 PM  Please call the care guide team at [phone] if you need to cancel or reschedule your appointment.   If you are experiencing a Mental Health or Behavioral Health Crisis or need someone to talk to, please call the Suicide and Crisis Lifeline: 988 call 911   Patient verbalizes understanding of instructions and care plan provided today and agrees to view in MyChart. Active MyChart status and patient understanding of how to access instructions and care plan via MyChart confirmed with patient.     Windy Fast [facility]  [facility], [facility] Clinical Social Worker Direct Dial: [phone]  Fax: [phone] Website: [email or web address] 12:42 PM]

## 2023-11-02 NOTE — Patient Outreach (Signed)
 Care Coordination   Follow Up Visit Note   11/02/2023 Name: Karl Torres MRN: [id number] DOB: Nov 03, 1975  Karl Torres is a 48 y.o. year old male who sees [name], [name], NP for primary care. I spoke with  Lenard Galloway by phone today.  What matters to the patients health and wellness today?  Symptom Management and Community resources    Goals Addressed             This Visit's Progress    Strenghten Support-LCSW       Activities and task to complete in order to accomplish goals.   Keep all upcoming appointments discussed today Continue with compliance of taking medication prescribed by Doctor Implement healthy coping skills discussed to assist with management of symptoms Follow up with DSS to apply for SNAP benefits. Continue to obtain meals/goods from [employer]         SDOH assessments and interventions completed:  No     Care Coordination Interventions:  Yes, provided  Interventions Today    Flowsheet Row Most Recent Value  Chronic Disease   Chronic disease during today's visit Diabetes, Hypertension (HTN)  General Interventions   General Interventions Discussed/Reviewed General Interventions Reviewed, Doctor Visits  Doctor Visits Discussed/Reviewed Doctor Visits Reviewed  Mental Health Interventions   Mental Health Discussed/Reviewed Mental Health Reviewed, Coping Strategies  [Financial strain discussed. LCSW provided encouragement and discussed strategies to assist with stress management. Pt continues to work with CCM team to strengthen support]  Nutrition Interventions   Nutrition Discussed/Reviewed Nutrition Reviewed, Decreasing sugar intake, Decreasing salt, Adding fruits and vegetables       Follow up plan: Follow up call scheduled for 2/25    Encounter Outcome:  Patient Visit Completed   Jenel Lucks, LCSW [place]  [facility], [facility] Clinical Social Worker Direct Dial: [phone]  Fax:  [phone] Website: [email or web address] 12:41 PM

## 2023-11-05 ENCOUNTER — Ambulatory Visit: Payer: Self-pay

## 2023-11-05 ENCOUNTER — Telehealth: Payer: Self-pay | Admitting: Pharmacist

## 2023-11-05 DIAGNOSIS — E1165 Type 2 diabetes mellitus with hyperglycemia: Secondary | ICD-10-CM

## 2023-11-05 NOTE — Progress Notes (Signed)
   11/05/2023  Patient ID: Karl Torres, male   DOB: [birth year], 48 y.o.   MRN: [id number]    Reason for referral: Medication Management  Referral source:  [employer] Diabetes Report    Reason for call: Diabetes Management  Outreach:  Unsuccessful telephone call attempt #1 to patient.   HIPAA compliant voicemail left requesting a return call  Plan:  -I will make another outreach attempt to patient in 1 week.    Beecher Mcardle, PharmD, BCACP Clinical Pharmacist [phone]

## 2023-11-05 NOTE — Patient Outreach (Signed)
 Care Coordination   Follow Up Visit Note   11/05/2023 Name: Karl Torres MRN: [id number] DOB: [birth year]  Karl Torres is a 48 y.o. year old male who sees [name], [name], NP for primary care. I spoke with  Karl Torres by phone today.  What matters to the patients health and wellness today?  Patient needs housing resources and food stamps. Patient has not followed through with applying for services, but agreed to contact agencies next week.    Goals Addressed             This Visit's Progress    Care Coordination Activities       Interventions Today    Flowsheet Row Most Recent Value  Chronic Disease   Chronic disease during today's visit Diabetes, Hypertension (HTN)  General Interventions   General Interventions Discussed/Reviewed General Interventions Discussed, General Interventions Reviewed, [employer] was able to obtain food assistance.Pt reports he went to Urgent Care and rested, therefore he did not go to [facility] or foodstamps but agreed to apply next week.]              SDOH assessments and interventions completed:  No     Care Coordination Interventions:  Yes, provided   Follow up plan: No further intervention required.   Encounter Outcome:  Patient Visit Completed

## 2023-11-05 NOTE — Patient Instructions (Signed)
 Visit Information  Thank you for taking time to visit with me today. Please don't hesitate to contact me if I can be of assistance to you.   Following are the goals we discussed today:  Patient will contact DSS to apply for foodstamps. Patient will contact [facility] and [facility] for housing services.    If you are experiencing a Mental Health or Behavioral Health Crisis or need someone to talk to, please call 911  Patient verbalizes understanding of instructions and care plan provided today and agrees to view in MyChart. Active MyChart status and patient understanding of how to access instructions and care plan via MyChart confirmed with patient.     No further follow up required: Patient does not request a follow up visit. Lysle Morales, BSW [place]  [facility], [facility] Social Worker Direct Dial: [phone]  Fax: [phone] Website: [email or web address]

## 2023-11-08 ENCOUNTER — Telehealth: Payer: Self-pay | Admitting: Pharmacist

## 2023-11-08 DIAGNOSIS — E1165 Type 2 diabetes mellitus with hyperglycemia: Secondary | ICD-10-CM

## 2023-11-08 NOTE — Progress Notes (Signed)
   11/08/2023  Patient ID: Karl Torres, male   DOB: 04-25-1976, 48 y.o.   MRN: [id number]  Reason for referral: Medication Management   Referral source:  [employer] Diabetes Report     Reason for call: Diabetes Management   Outreach:  Unsuccessful telephone call attempt #2 to patient.   HIPAA compliant voicemail left requesting a return call   Plan:  -I will make another outreach attempt to patient in 1 week.      Beecher Mcardle, PharmD, BCACP Clinical Pharmacist [phone]

## 2023-11-10 ENCOUNTER — Telehealth: Payer: Self-pay | Admitting: Pharmacist

## 2023-11-10 DIAGNOSIS — E1165 Type 2 diabetes mellitus with hyperglycemia: Secondary | ICD-10-CM

## 2023-11-10 NOTE — Progress Notes (Signed)
   11/10/2023  Patient ID: Karl Torres, male   DOB: 11/23/1975, 48 y.o.   MRN: [id number]  Reason for referral: Medication Management   Referral source:  [employer] Diabetes Report     Reason for call: Diabetes Management   Outreach:  Unsuccessful telephone call attempt #3 to patient.   HIPAA compliant voicemail left requesting a return call   Plan:  -I will make another outreach attempt to patient in 1 week.      Beecher Mcardle, PharmD, BCACP Clinical Pharmacist [phone]

## 2023-11-16 ENCOUNTER — Encounter: Payer: Self-pay | Admitting: Licensed Clinical Social Worker

## 2023-11-17 ENCOUNTER — Telehealth: Payer: Self-pay | Admitting: Pharmacist

## 2023-11-17 ENCOUNTER — Telehealth: Payer: Self-pay | Admitting: Licensed Clinical Social Worker

## 2023-11-17 DIAGNOSIS — E1165 Type 2 diabetes mellitus with hyperglycemia: Secondary | ICD-10-CM

## 2023-11-17 NOTE — Patient Instructions (Signed)
 Visit Information  Thank you for taking time to visit with me today. Please don't hesitate to contact me if I can be of assistance to you.   Following are the goals we discussed today:   Goals Addressed   None     Our next appointment is by telephone on 4/7 at 12:20 PM  Please call the care guide team at [phone] if you need to cancel or reschedule your appointment.   If you are experiencing a Mental Health or Behavioral Health Crisis or need someone to talk to, please call the Suicide and Crisis Lifeline: 988 call 911   Patient verbalizes understanding of instructions and care plan provided today and agrees to view in MyChart. Active MyChart status and patient understanding of how to access instructions and care plan via MyChart confirmed with patient.     Windy Fast [facility]  [facility], [facility] Clinical Social Worker Direct Dial: [phone]  Fax: [phone] Website: [email or web address] 4:46 PM

## 2023-11-17 NOTE — Patient Outreach (Signed)
 Care Coordination   11/17/2023 Name: Karl Torres MRN: [id number] DOB: 1976-02-23   Care Coordination Outreach Attempts:  An unsuccessful outreach was attempted for an appointment today.  Follow Up Plan:  Additional outreach attempts will be made to offer the patient complex care management information and services.   Encounter Outcome:  No Answer   Care Coordination Interventions:  No, not indicated    Jenel Lucks, LCSW [place]  [facility], [facility] Clinical Social Worker Direct Dial: [phone]  Fax: [phone] Website: [email or web address] 4:45 PM

## 2023-11-17 NOTE — Progress Notes (Signed)
   11/17/2023  Patient ID: Lenard Galloway, male   DOB: [birth year], 48 y.o.   MRN: [id number]    Reason for referral: Medication Management  Referral source:  [employer] Diabetes    Reason for call: Diabetes Management-[employer] Diabetes  Outreach:  Unsuccessful telephone call attempt #3 to patient.   HIPAA compliant voicemail left requesting a return call  Plan:  -I will make another outreach attempt to patient in 2 weeks.    Beecher Mcardle, PharmD, BCACP Clinical Pharmacist [phone]

## 2023-11-29 ENCOUNTER — Encounter: Payer: Self-pay | Admitting: Licensed Clinical Social Worker

## 2023-12-01 ENCOUNTER — Telehealth: Payer: Self-pay | Admitting: *Deleted

## 2023-12-01 NOTE — Progress Notes (Signed)
 Complex Care Management Care Guide Note  12/01/2023 Name: Khaliel Morey MRN: [id number] DOB: 24-Nov-1975  Burch Marchuk is a 48 y.o. year old male who is a primary care patient of [name], [name], NP and is actively engaged with the care management team. I reached out to Lenard Galloway by phone today to assist with re-scheduling  with the Licensed Clinical Child psychotherapist.  Follow up plan: Unsuccessful telephone outreach attempt made. A HIPAA compliant phone message was left for the patient providing contact information and requesting a return call.  Gwenevere Ghazi  [facility] Health  Value-Based Care Institute, [facility] Guide  Direct Dial: [phone]  Fax [phone]

## 2023-12-02 ENCOUNTER — Telehealth: Payer: Self-pay | Admitting: Pharmacist

## 2023-12-02 DIAGNOSIS — E1165 Type 2 diabetes mellitus with hyperglycemia: Secondary | ICD-10-CM

## 2023-12-02 NOTE — Telephone Encounter (Signed)
-----   Message from Nunzio Cobbs sent at 11/17/2023 10:32 AM EDT ----- Regarding: FW: TNM Call 4  ----- Message ----- From: Beecher Mcardle, RPH Sent: 11/17/2023   8:00 AM EDT To: Beecher Mcardle, RPH Subject: FW: TNM Call 4                                  ----- Message ----- From: Beecher Mcardle, [facility] Sent: 11/10/2023   8:00 AM EDT To: Beecher Mcardle, RPH Subject: FW: TNM Call 2                                  ----- Message ----- From: Beecher Mcardle, [facility] Sent: 11/05/2023   2:23 PM EDT To: Beecher Mcardle, RPH Subject: TNM Call 2

## 2023-12-02 NOTE — Progress Notes (Signed)
   12/02/2023  Patient ID: Karl Torres, male   DOB: Aug 09, 1976, 48 y.o.   MRN: [id number]  Reason for referral: Medication Management   Referral source:  [employer] Diabetes     Reason for call: Diabetes Management-[employer] Diabetes   Outreach:  Unsuccessful telephone call attempt #4 to patient.   HIPAA compliant voicemail left requesting a return call   Plan:  I will  make a final out reach in 3-5 weeks   Beecher Mcardle, PharmD, [facility] Clinical Pharmacist [phone] .      Beecher Mcardle, PharmD, BCACP Clinical Pharmacist [phone]

## 2023-12-06 NOTE — Progress Notes (Signed)
 Complex Care Management Care Guide Note  12/06/2023 Name: Athel Merriweather MRN: [id number] DOB: March 16, 1976  Tyjon Bowen is a 48 y.o. year old male who is a primary care patient of [name], [name], NP and is actively engaged with the care management team. I reached out to Carlyn Childs by phone today to assist with re-scheduling  with the BSW.  Follow up plan: Unsuccessful telephone outreach attempt made. A HIPAA compliant phone message was left for the patient providing contact information and requesting a return call. No further outreach attempts will be made at this time. We have been unable to contact the patient to reschedule for complex care management services.  Barnie Bora  [facility]  Value-Based Care Institute, [facility] Guide  Direct Dial: [phone]  Fax [phone]

## 2023-12-10 ENCOUNTER — Telehealth: Payer: Self-pay | Admitting: Pharmacist

## 2023-12-10 DIAGNOSIS — E1165 Type 2 diabetes mellitus with hyperglycemia: Secondary | ICD-10-CM

## 2023-12-10 NOTE — Progress Notes (Signed)
   12/10/2023  Patient ID: Karl Torres, male   DOB: 1976/08/17, 48 y.o.   MRN: [id number]  Reason for referral: Medication Management   Referral source:  [employer] Diabetes     Reason for call: Diabetes Management-[employer] Diabetes   Outreach:  Unsuccessful telephone call attempt #5 to patient.   HIPAA compliant voicemail left requesting a return call      Geronimo Krabbe, PharmD, [facility] Clinical Pharmacist [phone] .

## 2024-01-13 ENCOUNTER — Telehealth: Payer: Self-pay | Admitting: Pharmacist

## 2024-01-13 DIAGNOSIS — E1165 Type 2 diabetes mellitus with hyperglycemia: Secondary | ICD-10-CM

## 2024-01-13 NOTE — Progress Notes (Signed)
   01/13/2024  Patient ID: Karl Torres, male   DOB: Aug 08, 1976, 48 y.o.   MRN: [id number]  Reason for referral: Medication Management   Referral source:  [employer] Metric Diabetes     Reason for call: Diabetes Management-[employer] [facility] Metric Diabetes  HgA1c-->14% January 2025   Upcoming PCP appointment 03/17/24   Outreach:  Unsuccessful telephone call attempt #5 to patient.   HIPAA compliant voicemail left requesting a return call   Plan:  I will  make a final out reach in 3-5 weeks     Geronimo Krabbe, PharmD, [facility] Clinical Pharmacist [phone] .

## 2024-01-31 ENCOUNTER — Emergency Department (HOSPITAL_COMMUNITY)
Admission: EM | Admit: 2024-01-31 | Discharge: 2024-01-31 | Disposition: A | Discharge status: Home / Self Care | Attending: Emergency Medicine | Admitting: Emergency Medicine

## 2024-01-31 DIAGNOSIS — Z7984 Long term (current) use of oral hypoglycemic drugs: Secondary | ICD-10-CM | POA: Diagnosis not present

## 2024-01-31 DIAGNOSIS — R1013 Epigastric pain: Secondary | ICD-10-CM | POA: Diagnosis not present

## 2024-01-31 DIAGNOSIS — Z7982 Long term (current) use of aspirin: Secondary | ICD-10-CM | POA: Insufficient documentation

## 2024-01-31 DIAGNOSIS — Z91148 Patient's other noncompliance with medication regimen for other reason: Secondary | ICD-10-CM | POA: Diagnosis not present

## 2024-01-31 DIAGNOSIS — R739 Hyperglycemia, unspecified: Secondary | ICD-10-CM | POA: Diagnosis present

## 2024-01-31 DIAGNOSIS — E1165 Type 2 diabetes mellitus with hyperglycemia: Secondary | ICD-10-CM | POA: Insufficient documentation

## 2024-01-31 LAB — COMPREHENSIVE METABOLIC PANEL WITH GFR
ALT: 164 U/L — ABNORMAL HIGH (ref 0–44)
AST: 69 U/L — ABNORMAL HIGH (ref 15–41)
Albumin: 2.6 g/dL — ABNORMAL LOW (ref 3.5–5.0)
Alkaline Phosphatase: 124 U/L (ref 38–126)
Anion gap: 4 — ABNORMAL LOW (ref 5–15)
BUN: 5 mg/dL — ABNORMAL LOW (ref 6–20)
CO2: 23 mmol/L (ref 22–32)
Calcium: 7.6 mg/dL — ABNORMAL LOW (ref 8.9–10.3)
Chloride: 102 mmol/L (ref 98–111)
Creatinine, Ser: 0.72 mg/dL (ref 0.61–1.24)
GFR, Estimated: >60 mL/min (ref >60)
Glucose, Bld: 375 mg/dL — ABNORMAL HIGH (ref 70–99)
Potassium: 3.8 mmol/L (ref 3.5–5.1)
Sodium: 129 mmol/L — ABNORMAL LOW (ref 135–145)
Total Bilirubin: 0.8 mg/dL (ref 0.0–1.2)
Total Protein: 4.9 g/dL — ABNORMAL LOW (ref 6.5–8.1)

## 2024-01-31 LAB — I-STAT CHEM 8, ED
BUN: 5 mg/dL — ABNORMAL LOW (ref 6–20)
Calcium, Ion: 1.09 mmol/L — ABNORMAL LOW (ref 1.15–1.40)
Chloride: 100 mmol/L (ref 98–111)
Creatinine, Ser: 0.6 mg/dL — ABNORMAL LOW (ref 0.61–1.24)
Glucose, Bld: 367 mg/dL — ABNORMAL HIGH (ref 70–99)
HCT: 43 % (ref 39.0–52.0)
Hemoglobin: 14.6 g/dL (ref 13.0–17.0)
Potassium: 3.6 mmol/L (ref 3.5–5.1)
Sodium: 133 mmol/L — ABNORMAL LOW (ref 135–145)
TCO2: 22 mmol/L (ref 22–32)

## 2024-01-31 LAB — URINALYSIS, ROUTINE W REFLEX MICROSCOPIC
Bilirubin Urine: NEGATIVE
Glucose, UA: >=500 mg/dL — AB
Hgb urine dipstick: NEGATIVE
Ketones, ur: NEGATIVE mg/dL
Leukocytes,Ua: NEGATIVE
Nitrite: NEGATIVE
Protein, ur: NEGATIVE mg/dL
Specific Gravity, Urine: 1.036 — ABNORMAL HIGH (ref 1.005–1.030)
pH: 7 (ref 5.0–8.0)

## 2024-01-31 LAB — RAPID URINE DRUG SCREEN, HOSP PERFORMED
Amphetamines: NOT DETECTED
Barbiturates: NOT DETECTED
Benzodiazepines: NOT DETECTED
Cocaine: NOT DETECTED
Opiates: NOT DETECTED
Tetrahydrocannabinol: NOT DETECTED

## 2024-01-31 LAB — CBC
HCT: 45.5 % (ref 39.0–52.0)
Hemoglobin: 15.6 g/dL (ref 13.0–17.0)
MCH: 31.1 pg (ref 26.0–34.0)
MCHC: 34.3 g/dL (ref 30.0–36.0)
MCV: 90.6 fL (ref 80.0–100.0)
Platelets: 168 10*3/uL (ref 150–400)
RBC: 5.02 MIL/uL (ref 4.22–5.81)
RDW: 12.2 % (ref 11.5–15.5)
WBC: 5.4 10*3/uL (ref 4.0–10.5)
nRBC: 0 % (ref 0.0–0.2)

## 2024-01-31 LAB — CBG MONITORING, ED
Glucose-Capillary: 423 mg/dL — ABNORMAL HIGH (ref 70–99)
Glucose-Capillary: 245 mg/dL — ABNORMAL HIGH (ref 70–99)
Glucose-Capillary: 127 mg/dL — ABNORMAL HIGH (ref 70–99)

## 2024-01-31 LAB — LIPASE, BLOOD: Lipase: 20 U/L (ref 11–51)

## 2024-01-31 LAB — ETHANOL: Alcohol, Ethyl (B): <15 mg/dL (ref <15)

## 2024-01-31 MED ORDER — LANCETS MISC. MISC: 1.0000 | Freq: Three times a day (TID) | 0 refills | Status: AC

## 2024-01-31 MED ORDER — LANCET DEVICE MISC: 1.0000 | Freq: Three times a day (TID) | 0 refills | Status: AC

## 2024-01-31 MED ORDER — LACTATED RINGERS IV BOLUS
1000.0000 mL | Freq: Once | INTRAVENOUS | Status: AC
  Administered 2024-01-31: 1000 mL via INTRAVENOUS

## 2024-01-31 MED ORDER — ONDANSETRON HCL 4 MG/2ML IJ SOLN
4.0000 mg | Freq: Once | INTRAMUSCULAR | Status: AC
  Administered 2024-01-31: 4 mg via INTRAVENOUS
  Filled 2024-01-31: qty 2

## 2024-01-31 MED ORDER — INSULIN ASPART 100 UNIT/ML IJ SOLN
8.0000 [IU] | Freq: Once | INTRAMUSCULAR | Status: AC
  Administered 2024-01-31: 8 [IU] via SUBCUTANEOUS

## 2024-01-31 MED ORDER — BLOOD GLUCOSE TEST VI STRP
1.0000 | ORAL_STRIP | Freq: Three times a day (TID) | 0 refills | Status: AC
Start: 2024-01-31 — End: 2024-03-04

## 2024-01-31 MED ORDER — BLOOD GLUCOSE MONITORING SUPPL DEVI: 1.0000 | Freq: Three times a day (TID) | 0 refills | Status: DC

## 2024-01-31 MED ORDER — MORPHINE SULFATE (PF) 4 MG/ML IV SOLN
4.0000 mg | Freq: Once | INTRAVENOUS | Status: AC
  Administered 2024-01-31: 4 mg via INTRAVENOUS
  Filled 2024-01-31: qty 1

## 2024-01-31 NOTE — ED Notes (Signed)
 Checked patient cbg it was 93 notified RN of blood sugar

## 2024-01-31 NOTE — ED Provider Notes (Signed)
 Karl Torres Provider Note   CSN: [id number] Arrival date & time: 01/31/24  4098     History  Chief Complaint  Patient presents with   Hyperglycemia    Karl Torres is a 48 y.o. male.  Pt with hx diabetes, presents from work were he appeared generally weak and was noted to have blood sugar high, in the 400s. Pt notes hx dm, and indicates is not compliant w taking metformin . Denies prior insulin use. +polyuria and polydipsia. No vomiting. Also notes hx pancreatitis and feels that may have flared up - c/o epigastric pain, dull, non radiating. No fever or chills. No recent change in meds or new meds. No blood loss, rectal bleeding or melena.   The history is provided by the patient, medical records and the EMS personnel.  Hyperglycemia Associated symptoms: abdominal pain, increased thirst, nausea and polyuria   Associated symptoms: no chest pain, no dysuria, no fever, no shortness of breath and no vomiting        Home Medications Prior to Admission medications   Medication Sig Start Date End Date Taking? Authorizing Provider  Blood Glucose Monitoring Suppl DEVI 1 each by Does not apply route in the morning, at noon, and at bedtime. May substitute to any manufacturer covered by patient's insurance. 01/31/24  Yes [name], MD  Glucose Blood (BLOOD GLUCOSE TEST STRIPS) STRP 1 each by In Vitro route in the morning, at noon, and at bedtime. May substitute to any manufacturer covered by patient's insurance. 01/31/24 03/04/24 Yes [name], MD  Lancet Device MISC 1 each by Does not apply route in the morning, at noon, and at bedtime. May substitute to any manufacturer covered by patient's insurance. 01/31/24 03/01/24 Yes [name], MD  Lancets Misc. MISC 1 each by Does not apply route in the morning, at noon, and at bedtime. May substitute to any manufacturer covered by patient's insurance. 01/31/24 03/01/24 Yes [name], MD  aspirin  EC 81 MG  tablet Take 1 tablet (81 mg total) by mouth daily. Swallow whole. 09/17/23   [name], [name], NP  atorvastatin  (LIPITOR) 10 MG tablet Take 1 tablet (10 mg total) by mouth daily. 09/17/23   [name], [name], NP  Blood Glucose Monitoring Suppl DEVI 1 each by Does not apply route in the morning, at noon, and at bedtime. May substitute to any manufacturer covered by patient's insurance. 09/17/23   [name], [name], NP  fluticasone  (FLONASE ) 50 MCG/ACT nasal spray Place 2 sprays into both nostrils daily. 09/17/23   [name], [name], NP  Glucose Blood (BLOOD GLUCOSE TEST STRIPS) STRP 1 each by In Vitro route in the morning, at noon, and at bedtime. May substitute to any manufacturer covered by patient's insurance. 09/17/23   [name], [name], NP  metFORMIN  (GLUCOPHAGE ) 1000 MG tablet Take 1 tablet (1,000 mg total) by mouth 2 (two) times daily with a meal. 09/27/23   [name], [name], NP      Allergies    Patient has no known allergies.    Review of Systems   Review of Systems  Constitutional:  Negative for chills and fever.  HENT:  Negative for sore throat.   Eyes:  Negative for redness.  Respiratory:  Negative for cough and shortness of breath.   Cardiovascular:  Negative for chest pain.  Gastrointestinal:  Positive for abdominal pain and nausea. Negative for vomiting.  Endocrine: Positive for polydipsia and polyuria.  Genitourinary:  Negative for dysuria and flank  pain.  Musculoskeletal:  Negative for back pain and neck pain.  Skin:  Negative for rash.  Neurological:  Negative for headaches.    Physical Exam Updated Vital Signs BP 114/76   Pulse 60   Temp 98.3 F (36.8 C) (Oral)   Resp 14   Ht 1.727 m (5\' 8" )   Wt 76.6 kg   SpO2 100%   BMI 25.68 kg/m  Physical Exam Vitals and nursing note reviewed.  Constitutional:      Appearance: Normal appearance. He is well-developed.  HENT:     Head: Atraumatic.     Nose: Nose normal.     Mouth/Throat:     Mouth: Mucous membranes are  moist.     Pharynx: Oropharynx is clear.  Eyes:     General: No scleral icterus.    Conjunctiva/sclera: Conjunctivae normal.     Pupils: Pupils are equal, round, and reactive to light.  Neck:     Trachea: No tracheal deviation.  Cardiovascular:     Rate and Rhythm: Normal rate and regular rhythm.     Pulses: Normal pulses.     Heart sounds: Normal heart sounds. No murmur heard.    No friction rub. No gallop.  Pulmonary:     Effort: Pulmonary effort is normal. No accessory muscle usage or respiratory distress.     Breath sounds: Normal breath sounds.  Abdominal:     General: Bowel sounds are normal. There is no distension.     Palpations: Abdomen is soft. There is no mass.     Tenderness: There is abdominal tenderness. There is no guarding or rebound.     Hernia: No hernia is present.     Comments: Epigastric tenderness.   Genitourinary:    Comments: No cva tenderness. Musculoskeletal:        General: No swelling or tenderness.     Cervical back: Normal range of motion and neck supple. No rigidity.     Right lower leg: No edema.     Left lower leg: No edema.  Skin:    General: Skin is warm and dry.     Findings: No rash.  Neurological:     Mental Status: He is alert.     Comments: Alert, speech clear.   Psychiatric:        Mood and Affect: Mood normal.     ED Results / Procedures / Treatments   Labs (all labs ordered are listed, but only abnormal results are displayed) Results for orders placed or performed during the hospital encounter of 01/31/24  CBG monitoring, ED   Collection Time: 01/31/24  8:40 AM  Result Value Ref Range   Glucose-Capillary 423 (H) 70 - 99 mg/dL  Urinalysis, Routine w reflex microscopic -Urine, Clean Catch   Collection Time: 01/31/24  8:55 AM  Result Value Ref Range   Color, Urine YELLOW YELLOW   APPearance HAZY (A) CLEAR   Specific Gravity, Urine 1.036 (H) 1.005 - 1.030   pH 7.0 5.0 - 8.0   Glucose, UA >=500 (A) NEGATIVE mg/dL   Hgb  urine dipstick NEGATIVE NEGATIVE   Bilirubin Urine NEGATIVE NEGATIVE   Ketones, ur NEGATIVE NEGATIVE mg/dL   Protein, ur NEGATIVE NEGATIVE mg/dL   Nitrite NEGATIVE NEGATIVE   Leukocytes,Ua NEGATIVE NEGATIVE   RBC / HPF 0-5 0 - 5 RBC/hpf   WBC, UA 0-5 0 - 5 WBC/hpf   Bacteria, UA RARE (A) NONE SEEN   Squamous Epithelial / HPF 0-5 0 - 5 /HPF  Ca Oxalate Crys, UA PRESENT   CBC   Collection Time: 01/31/24  9:08 AM  Result Value Ref Range   WBC 5.4 4.0 - 10.5 K/uL   RBC 5.02 4.22 - 5.81 MIL/uL   Hemoglobin 15.6 13.0 - 17.0 g/dL   HCT 72.5 36.6 - 44.0 %   MCV 90.6 80.0 - 100.0 fL   MCH 31.1 26.0 - 34.0 pg   MCHC 34.3 30.0 - 36.0 g/dL   RDW 34.7 42.5 - 95.6 %   Platelets 168 150 - 400 K/uL   nRBC 0.0 0.0 - 0.2 %  Rapid urine drug screen (hospital performed)   Collection Time: 01/31/24  9:21 AM  Result Value Ref Range   Opiates NONE DETECTED NONE DETECTED   Cocaine NONE DETECTED NONE DETECTED   Benzodiazepines NONE DETECTED NONE DETECTED   Amphetamines NONE DETECTED NONE DETECTED   Tetrahydrocannabinol NONE DETECTED NONE DETECTED   Barbiturates NONE DETECTED NONE DETECTED  Comprehensive metabolic panel with GFR   Collection Time: 01/31/24 10:17 AM  Result Value Ref Range   Sodium 129 (L) 135 - 145 mmol/L   Potassium 3.8 3.5 - 5.1 mmol/L   Chloride 102 98 - 111 mmol/L   CO2 23 22 - 32 mmol/L   Glucose, Bld 375 (H) 70 - 99 mg/dL   BUN 5 (L) 6 - 20 mg/dL   Creatinine, Ser 3.87 0.61 - 1.24 mg/dL   Calcium  7.6 (L) 8.9 - 10.3 mg/dL   Total Protein 4.9 (L) 6.5 - 8.1 g/dL   Albumin 2.6 (L) 3.5 - 5.0 g/dL   AST 69 (H) 15 - 41 U/L   ALT 164 (H) 0 - 44 U/L   Alkaline Phosphatase 124 38 - 126 U/L   Total Bilirubin 0.8 0.0 - 1.2 mg/dL   GFR, Estimated >56 >43 mL/min   Anion gap 4 (L) 5 - 15  Lipase, blood   Collection Time: 01/31/24 10:17 AM  Result Value Ref Range   Lipase 20 11 - 51 U/L  Ethanol   Collection Time: 01/31/24 10:17 AM  Result Value Ref Range   Alcohol, Ethyl (B)  <15 <15 mg/dL  I-stat chem 8, ed   Collection Time: 01/31/24 10:24 AM  Result Value Ref Range   Sodium 133 (L) 135 - 145 mmol/L   Potassium 3.6 3.5 - 5.1 mmol/L   Chloride 100 98 - 111 mmol/L   BUN 5 (L) 6 - 20 mg/dL   Creatinine, Ser 3.29 (L) 0.61 - 1.24 mg/dL   Glucose, Bld 518 (H) 70 - 99 mg/dL   Calcium , Ion 1.09 (L) 1.15 - 1.40 mmol/L   TCO2 22 22 - 32 mmol/L   Hemoglobin 14.6 13.0 - 17.0 g/dL   HCT 84.1 66.0 - 63.0 %  CBG monitoring, ED   Collection Time: 01/31/24 12:00 PM  Result Value Ref Range   Glucose-Capillary 245 (H) 70 - 99 mg/dL  POC CBG, ED   Collection Time: 01/31/24  1:02 PM  Result Value Ref Range   Glucose-Capillary 127 (H) 70 - 99 mg/dL      EKG EKG Interpretation Date/Time:  Monday January 31 2024 08:48:55 EDT Ventricular Rate:  87 PR Interval:  152 QRS Duration:  98 QT Interval:  387 QTC Calculation: 466 R Axis:   89  Text Interpretation: Sinus rhythm Premature atrial complexes Nonspecific ST abnormality Baseline wander Confirmed by [name] ([id number]) on 01/31/2024 9:08:01 AM  Radiology No results found.  Procedures Procedures    Medications Ordered in  ED Medications  lactated ringers bolus 1,000 mL (0 mLs Intravenous Stopped 01/31/24 1040)  insulin aspart (novoLOG) injection 8 Units (8 Units Subcutaneous Given 01/31/24 1054)  lactated ringers bolus 1,000 mL (0 mLs Intravenous Stopped 01/31/24 1159)  morphine (PF) 4 MG/ML injection 4 mg (4 mg Intravenous Given 01/31/24 1111)  ondansetron (ZOFRAN) injection 4 mg (4 mg Intravenous Given 01/31/24 1111)    ED Course/ Medical Decision Making/ A&P                                 Medical Decision Making Problems Addressed: Hyperglycemia: acute illness or injury with systemic symptoms that poses a threat to life or bodily functions Non compliance w medication regimen: chronic illness or injury  Amount and/or Complexity of Data Reviewed Independent Historian: EMS    Details: hx External Data  Reviewed: notes. Labs: ordered. Decision-making details documented in ED Course.  Risk OTC drugs. Prescription drug management. Decision regarding hospitalization.   Iv ns. Continuous pulse ox and cardiac monitoring. Labs ordered/sent.   Differential diagnosis includes hyperglycemia, dka, dehydration, pancreatitis, etc. Dispo decision including potential need for admission considered - will get labs and reassess.   Reviewed nursing notes and prior charts for additional history. External reports reviewed. Additional history from: EMS.  LR bolus.   Cardiac monitor: sinus rhythm, rate 55.  Labs reviewed/interpreted by me - wbc and hgb normal. glucose high. Hco3 normal. Ua neg for uti. Lipase is normal.   IVF bolus. Novolog sq.   Patient indicates is hungry, asking for food/drink - provided.   Abd soft nt. No nv.   Glucose improved.   Further discussion w patient - pt indicates has prescriptions for metformin  but is not currently taking and has not taken in long while. He also indicates has been given rx for glucometer and supplies, but has not obtained.   Diabetes coordinator called - discussed pt - she indicates she will put in for referral for outpatient diabetes management program, and cm will f/u with patient.   Pt is encouraged must get his glucometer and meds and take as directed, must monitor blood sugars 4x/day and f/u closely w pcp.   Blood sugar improved on recheck. Pt has eaten/drank, indicates feels ready for d/c. No abd pain or tenderness. No nv. Vitals normal.   Rec close pcp f/u.  Return precautions provided.          Final Clinical Impression(s) / ED Diagnoses Final diagnoses:  Hyperglycemia  Non compliance w medication regimen    Rx / DC Orders ED Discharge Orders          Ordered    Blood Glucose Monitoring Suppl DEVI  3 times daily        01/31/24 1511    Glucose Blood (BLOOD GLUCOSE TEST STRIPS) STRP  3 times daily        01/31/24 1511     Lancet Device MISC  3 times daily        01/31/24 1511    Lancets Misc. MISC  3 times daily        01/31/24 1511              [name], MD 01/31/24 1512

## 2024-01-31 NOTE — Discharge Instructions (Addendum)
 It was our pleasure to provide your ER care today - we hope that you feel better.  Drink plenty of water/fluids/stay well hydrated. Follow diabetes meal plan. It is extremely important for your short and long term health that you take better care of your diabetes (failure to control your blood sugar will cause heart disease, strokes, and your kidneys to fail such that you will be on dialysis to survive). This includes getting the glucometer and supplies as prescribed, and checking blood sugars 4x/day (before meals and at bedtime) and record values. Take your metformin  as prescribed by your doctor.   We made a referral to the diabetes teaching program, they should be contacting you to discuss diabetes teaching and follow up.  Follow up with primary care doctor in the coming week - bring copy of blood sugars with you to the visit.  Call your doctors office today or in AM tomorrow to arrange follow up with them this week.   Return to ER if worse, new symptoms, fevers, new/severe pain, new or worsening or severe abdominal pain, persistent vomiting, weak/fainting, chest pain, trouble breathing, or other concern.   You were given pain meds in the ER - no driving for the next 6 hours.

## 2024-01-31 NOTE — Inpatient Diabetes Management (Signed)
 Inpatient Diabetes Program Recommendations  AACE/ADA: New Consensus Statement on Inpatient Glycemic Control (2015)  Target Ranges:  Prepandial:   less than 140 mg/dL      Peak postprandial:   less than 180 mg/dL (1-2 hours)      Critically ill patients:  140 - 180 mg/dL   Lab Results  Component Value Date   GLUCAP 127 (H) 01/31/2024   HGBA1C >14.0 (H) 09/17/2023    Review of Glycemic Control  Diabetes history: DM 2  Outpatient Diabetes medications: metformin    Spoke with pt at bedside regarding Diabetes and self-management at home. Pt established care with Monina [name], NP January this year. At the same time pt had an A1c of >14% and was started on metformin . Pt reports he did not receive a glucometer to monitor trends at home. Pt also reports 6 years ago he was told he had diabetes and was placed on oral medications at that time as well. Spoke to pt about lifestyle modifications and that his glucose levels are too elevated to be controlled by metformin  alone. Pt reports trying to focus on hydrating with watermelon and he feels that has helped. I cautioned pt regarding the natural sugars in fruit and advised him to only consume a portion of watermelon. Pt declines to reduce the amount of watermelon he is doing because he feels it is helping. I discussed glucose and A1c goals. Discussed checking glucose before the meals and see the effects of his diet on his glucose. Advised pt to follow up within 1-2 weeks with his PCP for management.  Thanks,  Eloise Hake RN, MSN, BC-ADM Inpatient Diabetes Coordinator Team Pager [phone] (8a-5p)

## 2024-01-31 NOTE — ED Triage Notes (Addendum)
 Pt BIB EMS from work after coworkers noticed he was wobbly and weak. His initial check was in the 400's. Hx of diabetes, on Metformin . Pt states 2 days ago felt lightheaded and dizzy with headache as well. Hx of pancreatitis, c/o stomach pain. Aox4.

## 2024-02-01 LAB — HEMOGLOBIN A1C
Hgb A1c MFr Bld: >15.5 % — ABNORMAL HIGH (ref 4.8–5.6)
Mean Plasma Glucose: >398 mg/dL

## 2024-02-02 ENCOUNTER — Ambulatory Visit (HOSPITAL_COMMUNITY): Payer: Self-pay

## 2024-02-17 ENCOUNTER — Telehealth: Payer: Self-pay | Admitting: Pharmacist

## 2024-02-17 DIAGNOSIS — E1165 Type 2 diabetes mellitus with hyperglycemia: Secondary | ICD-10-CM

## 2024-02-17 NOTE — Progress Notes (Signed)
   02/17/2024  Patient ID: Karl Torres, male   DOB: September 15, 1975, 48 y.o.   MRN: [id number]  Patient was called for diabetes management as He as been on the [facility] Diabetes report for quite some time.  I have called the Patient>5 times and each time has been unsuccessful. A message was left on his voicemail today requesting a call back.  Patient's HgA1c is now >15.    Metformin  is on his medication list but there is no fill history to support it being filled this year.  From chart review, Patient went to the ED for hyperglycemia recently but no medications were added to his regimen.  Patient has an upcoming appointment with his PCP on 03/17/24.  Patient could benefit from GLP-1 therapy ( if there are not contraindications) and/or Insulin .   Plan: Call Patient back in 1 week.   [name], PharmD, BCACP Clinical Pharmacist [phone]

## 2024-02-28 ENCOUNTER — Telehealth: Payer: Self-pay | Admitting: Pharmacist

## 2024-02-28 DIAGNOSIS — E1165 Type 2 diabetes mellitus with hyperglycemia: Secondary | ICD-10-CM

## 2024-02-28 NOTE — Progress Notes (Signed)
   02/28/2024  Patient ID: Karl Torres, male   DOB: 1976-07-18, 48 y.o.   MRN: [id number]   Patient was called regarding medication management of diabetes. Unfortunately, He did not answer the phone. HIPAA compliant message was left on his voicemail. Today's call was the sixth unsuccessful phone call.  Patient has an upcoming appointment with Roxan Bolds, NP on 03/17/24.  HgA1c->15.5%  Metformin  is on the medication list but has not been filled since last year.  Plan:  Call Patient back after his PCP appointment.   [name], PharmD, BCACP Clinical Pharmacist [phone]

## 2024-03-16 ENCOUNTER — Emergency Department (HOSPITAL_COMMUNITY): Payer: Self-pay

## 2024-03-16 ENCOUNTER — Observation Stay (HOSPITAL_COMMUNITY)
Admission: EM | Admit: 2024-03-16 | Discharge: 2024-03-18 | Disposition: A | Discharge status: Home / Self Care | Payer: Self-pay | Attending: Student | Admitting: Student

## 2024-03-16 ENCOUNTER — Encounter (HOSPITAL_COMMUNITY): Payer: Self-pay | Admitting: Emergency Medicine

## 2024-03-16 ENCOUNTER — Observation Stay (HOSPITAL_COMMUNITY): Payer: Self-pay

## 2024-03-16 ENCOUNTER — Other Ambulatory Visit: Payer: Self-pay

## 2024-03-16 DIAGNOSIS — E876 Hypokalemia: Secondary | ICD-10-CM | POA: Insufficient documentation

## 2024-03-16 DIAGNOSIS — S0093XA Contusion of unspecified part of head, initial encounter: Secondary | ICD-10-CM | POA: Insufficient documentation

## 2024-03-16 DIAGNOSIS — Z7982 Long term (current) use of aspirin: Secondary | ICD-10-CM | POA: Insufficient documentation

## 2024-03-16 DIAGNOSIS — F10929 Alcohol use, unspecified with intoxication, unspecified: Secondary | ICD-10-CM | POA: Diagnosis present

## 2024-03-16 DIAGNOSIS — R739 Hyperglycemia, unspecified: Secondary | ICD-10-CM

## 2024-03-16 DIAGNOSIS — W19XXXA Unspecified fall, initial encounter: Secondary | ICD-10-CM | POA: Diagnosis present

## 2024-03-16 DIAGNOSIS — I10 Essential (primary) hypertension: Secondary | ICD-10-CM | POA: Diagnosis present

## 2024-03-16 DIAGNOSIS — R4020 Unspecified coma: Principal | ICD-10-CM

## 2024-03-16 DIAGNOSIS — Z6823 Body mass index (BMI) 23.0-23.9, adult: Secondary | ICD-10-CM | POA: Insufficient documentation

## 2024-03-16 DIAGNOSIS — Z794 Long term (current) use of insulin: Secondary | ICD-10-CM | POA: Insufficient documentation

## 2024-03-16 DIAGNOSIS — E1100 Type 2 diabetes mellitus with hyperosmolarity without nonketotic hyperglycemic-hyperosmolar coma (NKHHC): Principal | ICD-10-CM | POA: Diagnosis present

## 2024-03-16 DIAGNOSIS — F10920 Alcohol use, unspecified with intoxication, uncomplicated: Secondary | ICD-10-CM | POA: Insufficient documentation

## 2024-03-16 DIAGNOSIS — F17220 Nicotine dependence, chewing tobacco, uncomplicated: Secondary | ICD-10-CM | POA: Insufficient documentation

## 2024-03-16 DIAGNOSIS — E1165 Type 2 diabetes mellitus with hyperglycemia: Principal | ICD-10-CM | POA: Insufficient documentation

## 2024-03-16 DIAGNOSIS — R7401 Elevation of levels of liver transaminase levels: Secondary | ICD-10-CM | POA: Diagnosis present

## 2024-03-16 DIAGNOSIS — E1149 Type 2 diabetes mellitus with other diabetic neurological complication: Secondary | ICD-10-CM | POA: Insufficient documentation

## 2024-03-16 LAB — I-STAT CHEM 8, ED
BUN: <3 mg/dL — ABNORMAL LOW (ref 6–20)
Calcium, Ion: 1.12 mmol/L — ABNORMAL LOW (ref 1.15–1.40)
Chloride: 98 mmol/L (ref 98–111)
Creatinine, Ser: 0.6 mg/dL — ABNORMAL LOW (ref 0.61–1.24)
Glucose, Bld: >700 mg/dL (ref 70–99)
HCT: 46 % (ref 39.0–52.0)
Hemoglobin: 15.6 g/dL (ref 13.0–17.0)
Potassium: 3.5 mmol/L (ref 3.5–5.1)
Sodium: 135 mmol/L (ref 135–145)
TCO2: 18 mmol/L — ABNORMAL LOW (ref 22–32)

## 2024-03-16 LAB — URINALYSIS, ROUTINE W REFLEX MICROSCOPIC
Bacteria, UA: NONE SEEN
Bilirubin Urine: NEGATIVE
Glucose, UA: 50 mg/dL — AB
Hgb urine dipstick: NEGATIVE
Ketones, ur: NEGATIVE mg/dL
Leukocytes,Ua: NEGATIVE
Nitrite: NEGATIVE
Protein, ur: NEGATIVE mg/dL
Specific Gravity, Urine: 1.009 (ref 1.005–1.030)
pH: 6 (ref 5.0–8.0)

## 2024-03-16 LAB — BASIC METABOLIC PANEL WITH GFR
Anion gap: 6 (ref 5–15)
Anion gap: 5 (ref 5–15)
BUN: <5 mg/dL — ABNORMAL LOW (ref 6–20)
BUN: 5 mg/dL — ABNORMAL LOW (ref 6–20)
CO2: 31 mmol/L (ref 22–32)
CO2: 26 mmol/L (ref 22–32)
Calcium: 8.3 mg/dL — ABNORMAL LOW (ref 8.9–10.3)
Calcium: 8 mg/dL — ABNORMAL LOW (ref 8.9–10.3)
Chloride: 103 mmol/L (ref 98–111)
Chloride: 107 mmol/L (ref 98–111)
Creatinine, Ser: 0.6 mg/dL — ABNORMAL LOW (ref 0.61–1.24)
Creatinine, Ser: 0.55 mg/dL — ABNORMAL LOW (ref 0.61–1.24)
GFR, Estimated: >60 mL/min (ref >60)
GFR, Estimated: >60 mL/min (ref >60)
Glucose, Bld: 293 mg/dL — ABNORMAL HIGH (ref 70–99)
Glucose, Bld: 129 mg/dL — ABNORMAL HIGH (ref 70–99)
Potassium: 4.1 mmol/L (ref 3.5–5.1)
Potassium: 3.3 mmol/L — ABNORMAL LOW (ref 3.5–5.1)
Sodium: 140 mmol/L (ref 135–145)
Sodium: 138 mmol/L (ref 135–145)

## 2024-03-16 LAB — CBG MONITORING, ED
Glucose-Capillary: >600 mg/dL (ref 70–99)
Glucose-Capillary: >600 mg/dL (ref 70–99)
Glucose-Capillary: >600 mg/dL (ref 70–99)
Glucose-Capillary: 337 mg/dL — ABNORMAL HIGH (ref 70–99)
Glucose-Capillary: 438 mg/dL — ABNORMAL HIGH (ref 70–99)
Glucose-Capillary: 260 mg/dL — ABNORMAL HIGH (ref 70–99)
Glucose-Capillary: 187 mg/dL — ABNORMAL HIGH (ref 70–99)
Glucose-Capillary: 127 mg/dL — ABNORMAL HIGH (ref 70–99)
Glucose-Capillary: 92 mg/dL (ref 70–99)

## 2024-03-16 LAB — I-STAT VENOUS BLOOD GAS, ED
Acid-base deficit: 4 mmol/L — ABNORMAL HIGH (ref 0.0–2.0)
Bicarbonate: 22.5 mmol/L (ref 20.0–28.0)
Calcium, Ion: 1.1 mmol/L — ABNORMAL LOW (ref 1.15–1.40)
HCT: 46 % (ref 39.0–52.0)
Hemoglobin: 15.6 g/dL (ref 13.0–17.0)
O2 Saturation: 37 %
Potassium: 3.5 mmol/L (ref 3.5–5.1)
Sodium: 136 mmol/L (ref 135–145)
TCO2: 24 mmol/L (ref 22–32)
pCO2, Ven: 45 mmHg (ref 44–60)
pH, Ven: 7.306 (ref 7.25–7.43)
pO2, Ven: 24 mmHg — CL (ref 32–45)

## 2024-03-16 LAB — COMPREHENSIVE METABOLIC PANEL WITH GFR
ALT: 140 U/L — ABNORMAL HIGH (ref 0–44)
AST: 87 U/L — ABNORMAL HIGH (ref 15–41)
Albumin: 3.3 g/dL — ABNORMAL LOW (ref 3.5–5.0)
Alkaline Phosphatase: 148 U/L — ABNORMAL HIGH (ref 38–126)
Anion gap: 15 (ref 5–15)
BUN: <5 mg/dL — ABNORMAL LOW (ref 6–20)
CO2: 22 mmol/L (ref 22–32)
Calcium: 8.6 mg/dL — ABNORMAL LOW (ref 8.9–10.3)
Chloride: 98 mmol/L (ref 98–111)
Creatinine, Ser: 0.81 mg/dL (ref 0.61–1.24)
GFR, Estimated: >60 mL/min (ref >60)
Glucose, Bld: 904 mg/dL (ref 70–99)
Potassium: 3.5 mmol/L (ref 3.5–5.1)
Sodium: 135 mmol/L (ref 135–145)
Total Bilirubin: 0.6 mg/dL (ref 0.0–1.2)
Total Protein: 5.9 g/dL — ABNORMAL LOW (ref 6.5–8.1)

## 2024-03-16 LAB — RAPID URINE DRUG SCREEN, HOSP PERFORMED
Amphetamines: NOT DETECTED
Barbiturates: NOT DETECTED
Benzodiazepines: NOT DETECTED
Cocaine: NOT DETECTED
Opiates: NOT DETECTED
Tetrahydrocannabinol: NOT DETECTED

## 2024-03-16 LAB — CBC
HCT: 42.9 % (ref 39.0–52.0)
Hemoglobin: 14.9 g/dL (ref 13.0–17.0)
MCH: 31.8 pg (ref 26.0–34.0)
MCHC: 34.7 g/dL (ref 30.0–36.0)
MCV: 91.5 fL (ref 80.0–100.0)
Platelets: 213 K/uL (ref 150–400)
RBC: 4.69 MIL/uL (ref 4.22–5.81)
RDW: 13 % (ref 11.5–15.5)
WBC: 5.5 K/uL (ref 4.0–10.5)
nRBC: 0 % (ref 0.0–0.2)

## 2024-03-16 LAB — MRSA NEXT GEN BY PCR, NASAL: MRSA by PCR Next Gen: NOT DETECTED

## 2024-03-16 LAB — OSMOLALITY: Osmolality: 343 mosm/kg (ref 275–295)

## 2024-03-16 LAB — GLUCOSE, CAPILLARY
Glucose-Capillary: 294 mg/dL — ABNORMAL HIGH (ref 70–99)
Glucose-Capillary: 65 mg/dL — ABNORMAL LOW (ref 70–99)
Glucose-Capillary: 97 mg/dL (ref 70–99)

## 2024-03-16 LAB — CK: Total CK: 78 U/L (ref 49–397)

## 2024-03-16 LAB — ETHANOL: Alcohol, Ethyl (B): 142 mg/dL — ABNORMAL HIGH (ref <15)

## 2024-03-16 LAB — BETA-HYDROXYBUTYRIC ACID: Beta-Hydroxybutyric Acid: 0.17 mmol/L (ref 0.05–0.27)

## 2024-03-16 MED ORDER — BACITRACIN ZINC 500 UNIT/GM EX OINT
TOPICAL_OINTMENT | Freq: Two times a day (BID) | CUTANEOUS | Status: DC
  Administered 2024-03-17 (×3): 31.5 via TOPICAL
  Filled 2024-03-16: qty 28.4

## 2024-03-16 MED ORDER — INSULIN STARTER KIT- PEN NEEDLES (ENGLISH)
1.0000 | Freq: Once | Status: DC
  Filled 2024-03-16: qty 1

## 2024-03-16 MED ORDER — THIAMINE HCL 100 MG/ML IJ SOLN
100.0000 mg | Freq: Every day | INTRAMUSCULAR | Status: DC
  Filled 2024-03-16: qty 2

## 2024-03-16 MED ORDER — DEXTROSE IN LACTATED RINGERS 5 % IV SOLN: INTRAVENOUS | Status: DC

## 2024-03-16 MED ORDER — INSULIN ASPART 100 UNIT/ML IJ SOLN: 0.0000 [IU] | Freq: Every day | INTRAMUSCULAR | Status: DC

## 2024-03-16 MED ORDER — INSULIN ASPART 100 UNIT/ML IJ SOLN
10.0000 [IU] | Freq: Once | INTRAMUSCULAR | Status: AC
  Administered 2024-03-16: 10 [IU] via INTRAVENOUS

## 2024-03-16 MED ORDER — POTASSIUM CHLORIDE CRYS ER 10 MEQ PO TBCR
10.0000 meq | EXTENDED_RELEASE_TABLET | Freq: Once | ORAL | Status: AC
  Administered 2024-03-16: 10 meq via ORAL
  Filled 2024-03-16: qty 1

## 2024-03-16 MED ORDER — LORAZEPAM 1 MG PO TABS: 1.0000 mg | ORAL_TABLET | ORAL | Status: DC | PRN

## 2024-03-16 MED ORDER — CALCIUM GLUCONATE-NACL 2-0.675 GM/100ML-% IV SOLN
2.0000 g | Freq: Once | INTRAVENOUS | Status: AC
  Administered 2024-03-16: 2000 mg via INTRAVENOUS
  Filled 2024-03-16: qty 100

## 2024-03-16 MED ORDER — LACTATED RINGERS IV BOLUS
2000.0000 mL | Freq: Once | INTRAVENOUS | Status: AC
  Administered 2024-03-16: 2000 mL via INTRAVENOUS

## 2024-03-16 MED ORDER — INSULIN GLARGINE-YFGN 100 UNIT/ML ~~LOC~~ SOLN
12.0000 [IU] | SUBCUTANEOUS | Status: DC
  Filled 2024-03-16: qty 0.12

## 2024-03-16 MED ORDER — INSULIN REGULAR(HUMAN) IN NACL 100-0.9 UT/100ML-% IV SOLN
INTRAVENOUS | Status: DC
  Administered 2024-03-16: 16 [IU]/h via INTRAVENOUS
  Filled 2024-03-16: qty 100

## 2024-03-16 MED ORDER — LACTATED RINGERS IV SOLN: INTRAVENOUS | Status: DC

## 2024-03-16 MED ORDER — INSULIN REGULAR(HUMAN) IN NACL 100-0.9 UT/100ML-% IV SOLN: INTRAVENOUS | Status: DC

## 2024-03-16 MED ORDER — LIVING WELL WITH DIABETES BOOK
Freq: Once | Status: DC
  Filled 2024-03-16: qty 1

## 2024-03-16 MED ORDER — THIAMINE MONONITRATE 100 MG PO TABS
100.0000 mg | ORAL_TABLET | Freq: Every day | ORAL | Status: DC
  Administered 2024-03-16 – 2024-03-18 (×3): 100 mg via ORAL
  Filled 2024-03-16 (×3): qty 1

## 2024-03-16 MED ORDER — LORAZEPAM 2 MG/ML IJ SOLN: 1.0000 mg | INTRAMUSCULAR | Status: DC | PRN

## 2024-03-16 MED ORDER — ACETAMINOPHEN 650 MG RE SUPP: 650.0000 mg | Freq: Four times a day (QID) | RECTAL | Status: DC | PRN

## 2024-03-16 MED ORDER — POTASSIUM CHLORIDE 10 MEQ/100ML IV SOLN: 10.0000 meq | Freq: Once | INTRAVENOUS | Status: DC

## 2024-03-16 MED ORDER — FOLIC ACID 1 MG PO TABS
1.0000 mg | ORAL_TABLET | Freq: Every day | ORAL | Status: DC
  Administered 2024-03-16 – 2024-03-18 (×3): 1 mg via ORAL
  Filled 2024-03-16 (×3): qty 1

## 2024-03-16 MED ORDER — ACETAMINOPHEN 325 MG PO TABS
650.0000 mg | ORAL_TABLET | Freq: Four times a day (QID) | ORAL | Status: DC | PRN
  Administered 2024-03-16: 650 mg via ORAL
  Filled 2024-03-16: qty 2

## 2024-03-16 MED ORDER — INSULIN ASPART 100 UNIT/ML IJ SOLN
0.0000 [IU] | INTRAMUSCULAR | Status: DC
  Administered 2024-03-17: 2 [IU] via SUBCUTANEOUS

## 2024-03-16 MED ORDER — ALBUTEROL SULFATE (2.5 MG/3ML) 0.083% IN NEBU: 2.5000 mg | INHALATION_SOLUTION | Freq: Four times a day (QID) | RESPIRATORY_TRACT | Status: DC | PRN

## 2024-03-16 MED ORDER — LACTATED RINGERS IV BOLUS
20.0000 mL/kg | Freq: Once | INTRAVENOUS | Status: AC
  Administered 2024-03-16: 1540 mL via INTRAVENOUS

## 2024-03-16 MED ORDER — POTASSIUM CHLORIDE 10 MEQ/100ML IV SOLN
10.0000 meq | INTRAVENOUS | Status: AC
  Administered 2024-03-16 (×2): 10 meq via INTRAVENOUS
  Filled 2024-03-16 (×2): qty 100

## 2024-03-16 MED ORDER — INSULIN ASPART 100 UNIT/ML IJ SOLN
0.0000 [IU] | Freq: Three times a day (TID) | INTRAMUSCULAR | Status: DC
  Administered 2024-03-16: 8 [IU] via SUBCUTANEOUS

## 2024-03-16 MED ORDER — NICOTINE 14 MG/24HR TD PT24
14.0000 mg | MEDICATED_PATCH | Freq: Every day | TRANSDERMAL | Status: DC
  Administered 2024-03-16 – 2024-03-18 (×3): 14 mg via TRANSDERMAL
  Filled 2024-03-16 (×3): qty 1

## 2024-03-16 MED ORDER — DEXTROSE 50 % IV SOLN: 0.0000 mL | INTRAVENOUS | Status: DC | PRN

## 2024-03-16 MED ORDER — INSULIN GLARGINE-YFGN 100 UNIT/ML ~~LOC~~ SOLN
15.0000 [IU] | Freq: Every day | SUBCUTANEOUS | Status: DC
  Administered 2024-03-16: 15 [IU] via SUBCUTANEOUS
  Filled 2024-03-16: qty 0.15

## 2024-03-16 MED ORDER — SODIUM CHLORIDE 0.9% FLUSH
3.0000 mL | Freq: Two times a day (BID) | INTRAVENOUS | Status: DC
  Administered 2024-03-17 (×2): 3 mL via INTRAVENOUS

## 2024-03-16 MED ORDER — ADULT MULTIVITAMIN W/MINERALS CH
1.0000 | ORAL_TABLET | Freq: Every day | ORAL | Status: DC
  Administered 2024-03-16 – 2024-03-18 (×3): 1 via ORAL
  Filled 2024-03-16 (×3): qty 1

## 2024-03-16 NOTE — Evaluation (Addendum)
 Physical Therapy Evaluation & Discharge Patient Details Name: Karl Torres MRN: [id number] DOB: 10/21/1975 Today's Date: 03/16/2024  History of Present Illness  Pt is a 48 y.o. male who presented 03/16/24 with no recollection of how he was admitted to the hospital. It appears he may have passed out or had an incident to result in a head injury, resulting in a lump on the back of his head. CT head negative. PMH: DM, HTN   Clinical Impression  Pt presents with condition above. At baseline, pt is independent without DME, living alone in a motel room with a flight of stairs to access. 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 He reports he was placed on light duty after falling and hitting his head and has since had some balance deficits. He was able to perform all functional mobility without LOB, DME, or assistance this date though. He does display some questionable cognitive deficits, specifically in memory, but it is unclear if this is his baseline. All education completed and questions answered. Pt is currently functioning at/near his baseline, thus no further acute PT services needed. However, will recommend OPPT for work conditioning to assist pt in returning to full work load capacity per his request.  Vitals -  104/69 (80) & 61 bpm supine 113/68 (80) & 67 bpm sitting 110/77 (88) & 70 bpm standing 117/69 (84) & 58 bpm sitting after ambulating         If plan is discharge home, recommend the following: Assist for transportation (does not know where his moped is currently)   Can travel by private vehicle        Equipment Recommendations None recommended by PT  Recommendations for Other Services  Speech consult (for cognitive assessment)    Functional Status Assessment Patient has had a recent decline in their functional status and demonstrates the ability to make significant improvements in function in a reasonable and predictable amount of time.      Precautions / Restrictions Precautions Precautions: None Restrictions Weight Bearing Restrictions Per Provider Order: No      Mobility  Bed Mobility Overal bed mobility: Modified Independent             General bed mobility comments: HOB elevated, no assistance needed    Transfers Overall transfer level: Independent Equipment used: None               General transfer comment: No LOB, no assistance needed    Ambulation/Gait Ambulation/Gait assistance: Independent Gait Distance (Feet): 200 Feet Assistive device: None Gait Pattern/deviations: Step-through pattern Gait velocity: WFL     General Gait Details: Pt ambulates without LOB, even when cued to change head positions and directions. Able to increase speed when cued.  Stairs            Wheelchair Mobility     Tilt Bed    Modified Rankin (Stroke Patients Only)       Balance Overall balance assessment: Mild deficits observed, not formally tested (reports balance deficits at work, not so apparent currently here)                                           Pertinent Vitals/Pain Pain Assessment Pain Assessment: Faces Faces Pain Scale: Hurts a little bit Pain Location: posterior aspect of head; headache; neck Pain Descriptors / Indicators: Grimacing, Guarding, Headache, Sore Pain Intervention(s): Limited  activity within patient's tolerance, Monitored during session, Repositioned    Home Living Family/patient expects to be discharged to:: Other (Comment) (motel)                   Additional Comments: lives in [place] alone, x1 flight of STE, bil handrails in reach, tub/shower combo, standard toilet height, no DME, no assistance available at d/c    Prior Function Prior Level of Function : Independent/Modified Independent;Driving;Working/employed             Mobility Comments: No AD ADLs Comments: Drives a moped; works Marine scientist, Optician, dispensing at [employer]      Extremity/Trunk Assessment   Upper Extremity Assessment Upper Extremity Assessment: Overall WFL for tasks assessed    Lower Extremity Assessment Lower Extremity Assessment: RLE deficits/detail;LLE deficits/detail RLE Deficits / Details: reports mild numbness in R foot; MMT scores of >/= 4+/5 bil LLE Deficits / Details: denied numbness/tingling; MMT scores of >/= 4+/5 bil, but reports L thigh has been weak for a few months    Cervical / Trunk Assessment Cervical / Trunk Assessment: Normal  Communication   Communication Communication: No apparent difficulties    Cognition Arousal: Alert Behavior During Therapy: WFL for tasks assessed/performed   PT - Cognitive impairments: No family/caregiver present to determine baseline                       PT - Cognition Comments: Pt  needing repeated reminders to keep arm still for BP readinings, demonstrating some questionable STM deficits. Does not recall events leading up to hospitalization. Pt reports he hit his head weeks ago, but seems in accurate with report in chart Following commands: Intact       Cueing Cueing Techniques: Verbal cues     General Comments General comments (skin integrity, edema, etc.): Vitals - 104/69 (80) & 61 bpm supine, 113/68 (80) & 67 bpm sitting, 110/77 (88) & 70 bpm standing, 117/69 (84) & 58 bpm sitting after ambulating    Exercises     Assessment/Plan    PT Assessment All further PT needs can be met in the next venue of care  PT Problem List Decreased balance;Decreased mobility;Impaired sensation       PT Treatment Interventions      PT Goals (Current goals can be found in the Care Plan section)  Acute Rehab PT Goals Patient Stated Goal: to get back to working at full capacity PT Goal Formulation: All assessment and education complete, DC therapy Time For Goal Achievement: 03/17/24 Potential to Achieve Goals: Good    Frequency       Co-evaluation                AM-PAC PT 6 Clicks Mobility  Outcome Measure Help needed turning from your back to your side while in a flat bed without using bedrails?: None Help needed moving from lying on your back to sitting on the side of a flat bed without using bedrails?: None Help needed moving to and from a bed to a chair (including a wheelchair)?: None Help needed standing up from a chair using your arms (e.g., wheelchair or bedside chair)?: None Help needed to walk in hospital room?: None Help needed climbing 3-5 steps with a railing? : A Little 6 Click Score: 23    End of Session   Activity Tolerance: Patient tolerated treatment well Patient left: in bed;with call bell/phone within reach;with bed alarm set Nurse Communication: Mobility status;Other (comment) (BP)  PT Visit Diagnosis: Unsteadiness on feet (R26.81)    Time: 8396-8376 PT Time Calculation (min) (ACUTE ONLY): 20 min   Charges:   PT Evaluation $PT Eval Low Complexity: 1 Low   PT General Charges $$ ACUTE PT VISIT: 1 Visit         Theo Ferretti, PT, DPT Acute Rehabilitation Services  Office: [phone]   Theo CHRISTELLA Ferretti 03/16/2024, 5:13 PM

## 2024-03-16 NOTE — TOC Initial Note (Addendum)
 Transition of Care [facility]) - Initial/Assessment Note    Patient Details  Name: Karl Torres MRN: [id number] Date of Birth: May 02, 1976  Transition of Care [facility]) CM/SW Contact:    Lauraine FORBES Saa, LCSW Phone Number: 03/16/2024, 4:33 PM  Clinical Narrative:               4:33 PM CSW introduced self and role to patient. Patient confirmed he resides at home alone but does not have transportation for discharge or appointments. CSW informed patient of discharge transportation options (bus passes/taxi voucher) and of how to obtain them (follow up with bedside RN). Patient expressed understanding of the information and expressed preference in taxi voucher. CSW provided additional SDOH (food, housing, utilities, transportation) resources. Patient stated that he does not received food stamps and expressed interest in application. CSW provided food stamps application. Patient confirmed that he does not have SNF/HH/DME history or a PCP but expressed interest in becoming re-established with [facility]. CSW consulted financial counseling for insurance assistance. CSW provided substance use resources, as well.   Expected Discharge Plan: Home/Self Care Barriers to Discharge: Continued Medical Work up   Patient Goals and CMS Choice Patient states their goals for this hospitalization and ongoing recovery are:: to return home          Expected Discharge Plan and Services       Living arrangements for the past 2 months: Single Family Home                                      Prior Living Arrangements/Services Living arrangements for the past 2 months: Single Family Home Lives with:: Self Patient language and need for interpreter reviewed:: Yes Do you feel safe going back to the place where you live?: Yes      Need for Family Participation in Patient Care: No (Comment) Care giver support system in place?: No (comment)   Criminal Activity/Legal Involvement Pertinent to Current  Situation/Hospitalization: No - Comment as needed  Activities of Daily Living   ADL Screening (condition at time of admission) Independently performs ADLs?: Yes (appropriate for developmental age) Is the patient deaf or have difficulty hearing?: No Does the patient have difficulty seeing, even when wearing glasses/contacts?: No  Permission Sought/Granted Permission sought to share information with : Family Supports Permission granted to share information with : No (Contact information on chart)  Share Information with NAME: Karl Torres     Permission granted to share info w Relationship: Sister  Permission granted to share info w Contact Information: [phone]  Emotional Assessment Appearance:: Appears stated age Attitude/Demeanor/Rapport: Engaged Affect (typically observed): Accepting, Appropriate, Adaptable, Calm, Stable, Pleasant Orientation: : Oriented to Self, Oriented to Place, Oriented to Situation, Oriented to  Time Alcohol / Substance Use: Not Applicable Psych Involvement: No (comment)  Admission diagnosis:  Uncontrolled type 2 diabetes mellitus with hyperosmolar nonketotic hyperglycemia (HCC) [E11.00] Patient Active Problem List   Diagnosis Date Noted   Uncontrolled type 2 diabetes mellitus with hyperosmolar nonketotic hyperglycemia (HCC) 03/16/2024   Hyperlipidemia LDL goal <100 09/19/2023   Numbness of fingers of both hands 09/19/2023   Essential hypertension 09/03/2023   Type 2 diabetes mellitus with hyperglycemia, without long-term current use of insulin  (HCC) 09/03/2023   PCP:  Pcp, No Pharmacy:   [facility] [phone] - [place], [place] - [address]  [place] [address] Phone: [phone] Fax: [phone]  [facility] [address] [place] [address] Phone: [phone] Fax: [phone]     Social Drivers of Health (SDOH) Social  History: SDOH Screenings   Food Insecurity: Food Insecurity Present (03/16/2024)  Housing: High Risk (03/16/2024)  Transportation Needs: Unmet Transportation Needs (03/16/2024)  Utilities: At Risk (03/16/2024)  Depression (PHQ2-9): Low Risk  (09/27/2023)  Tobacco Use: High Risk (03/16/2024)   SDOH Interventions:     Readmission Risk Interventions     No data to display

## 2024-03-16 NOTE — ED Notes (Signed)
 Pt provided with sandwich from bagged lunch

## 2024-03-16 NOTE — ED Notes (Signed)
 MD Claudene notified about possible switch to long acting insulin  as pt CBG has been under 250 consecutively.  Awaiting MD Claudene orders at this time.

## 2024-03-16 NOTE — Progress Notes (Signed)
 Pt's CBG was 65 @ 2105, provided pt with graham crackers, peanut butter, & regular sprite. Hypoglycemia protocol implemented. Follow-up CBG was 97. Will continue to monitor.  Lonell LITTIE Lyme, RN

## 2024-03-16 NOTE — ED Notes (Signed)
 Full rainbow of tubes drawn and sent to lab.

## 2024-03-16 NOTE — Inpatient Diabetes Management (Signed)
 Inpatient Diabetes Program Recommendations  AACE/ADA: New Consensus Statement on Inpatient Glycemic Control (2015)  Target Ranges:  Prepandial:   less than 140 mg/dL      Peak postprandial:   less than 180 mg/dL (1-2 hours)      Critically ill patients:  140 - 180 mg/dL   Lab Results  Component Value Date   GLUCAP 127 (H) 03/16/2024   HGBA1C >15.5 (H) 01/31/2024    Review of Glycemic Control  Latest Reference Range & Units 03/16/24 06:28 03/16/24 07:20 03/16/24 08:50 03/16/24 09:23 03/16/24 10:25 03/16/24 11:27 03/16/24 12:32  Glucose-Capillary 70 - 99 mg/dL >399 (HH) >399 (HH) 561 (H) 337 (H) 260 (H) 187 (H) 127 (H)  (HH): Data is critically high (H): Data is abnormally high Diabetes history: Type 2 DM Outpatient Diabetes medications: Metformin  500 mg every day (NT) Current orders for Inpatient glycemic control: Iv insulin   Inpatient Diabetes Program Recommendations:    When ready to transition consider Semglee  12 units every day and Novolog  0-9 units Q4H.   Spoke with patient regarding outpatient diabetes management. States, I have been eating a lot of fruit to help my diabetes. Reviewed patient's current A1c of >15.5%. Explained what a A1c is and what it measures. Also reviewed goal A1c with patient, importance of good glucose control @ home, and blood sugar goals.  Reviewed patho of DM, role of pancreas, need for insulin , hypo vs hyper glycemia, interventions, vascular changes and commorbidities.  Patient will need a meter at discharge.  Admits to large amount of fruit and sugary beverages. Provided information regarding alternatives. Encouraged protein intake and CHO mindfulness.  Educated patient and spouse on insulin  pen use at home. Reviewed contents of insulin  flexpen starter kit. Reviewed all steps if insulin  pen including attachment of needle, 2-unit air shot, dialing up dose, giving injection, removing needle, disposal of sharps, storage of unused insulin , disposal of  insulin  etc. Patient able to provide successful return demonstration. Also reviewed troubleshooting with insulin  pen. MD to give patient Rxs for insulin  pens and insulin  pen needles. Will order dietitian consult, insulin  starter kit, LWWDM.  Assuming will need 70/30 at DC given no insurance.  Thanks, Tinnie Minus, MSN, RNC-OB Diabetes Coordinator [phone] (8a-5p)
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
## 2024-03-16 NOTE — ED Notes (Signed)
 CCMD contacted to place the patient on cardiac monitoring services.
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
## 2024-03-16 NOTE — ED Notes (Signed)
 CCMD called by this RN

## 2024-03-16 NOTE — H&P (Addendum)
 History and Physical    Patient: Karl Torres FMW:[id number] DOB: 06-15-1976 DOA: 03/16/2024 DOS: the patient was seen and examined on 03/16/2024 PCP: Pcp, No  Patient coming from: Home  Chief Complaint:  Chief Complaint  Patient presents with   Fall   Hyperglycemia   HPI: Karl Torres is a 48 y.o. male with medical history significant of hypertension and diabetes mellitus type II presents after waking up here in the hospital.  The patient woke up in the hospital with no recollection of the events leading to his admission. He does not remember passing out or any incidents prior to his arrival at the hospital. It was mentioned that he had gone to work and returned home before the incident, but these events are not remembered.  He sustained a head injury, resulting in a lump on the back of his head. There is no recollection of the fall or the injury itself.  He has a history of diabetes and is currently taking metformin . He denies the use of insulin  at home.  Social history reveals smoking half a pack of cigarettes per day and consuming alcohol approximately once a month, with two shots consumed prior to the hospital admission. He denies any drug use.  In the ED patient was noted to have stable vital signs.  Labs significant for glucose 904, CO2 22, anion gap 15, alk phosphatase 148, AST 87, ALT 140, beta hydroxybutyrate acid 0.17, and alcohol level elevated at 142.   Venous pH was noted to be within normal limits at 7.306.  CT scans of the head and neck were ordered, but patient declined need for this despite multiple attempts.  Patient had been given an 3540 ml of lactated Ringer 's, 40 mEq of potassium chloride  IV, and started on insulin  drip per protocol.  TRH called to admit.  Review of Systems: As mentioned in the history of present illness. All other systems reviewed and are negative. Past Medical History:  Diagnosis Date   Diabetes mellitus without complication (HCC)     Hypertension    History reviewed. No pertinent surgical history. Social History:  reports that he has been smoking cigarettes. He has never used smokeless tobacco. He reports current alcohol use. He reports that he does not currently use drugs.  No Known Allergies  Family History  Problem Relation Age of Onset   Clotting disorder Mother    Hypertension Mother    Lung cancer Mother    Hypertension Father    Hypertension Sister    Hypertension Sister    Clotting disorder Maternal Grandmother    Diabetes Paternal Grandmother    Mental illness Maternal Uncle     Prior to Admission medications   Medication Sig Start Date End Date Taking? Authorizing Provider  aspirin  EC 81 MG tablet Take 1 tablet (81 mg total) by mouth daily. Swallow whole. 09/17/23   [name], [name], NP  atorvastatin  (LIPITOR) 10 MG tablet Take 1 tablet (10 mg total) by mouth daily. 09/17/23   [name], [name], NP  Blood Glucose Monitoring Suppl DEVI 1 each by Does not apply route in the morning, at noon, and at bedtime. May substitute to any manufacturer covered by patient's insurance. 09/17/23   [name], [name], NP  Blood Glucose Monitoring Suppl DEVI 1 each by Does not apply route in the morning, at noon, and at bedtime. May substitute to any manufacturer covered by patient's insurance. 01/31/24   Steinl, Kevin, MD  fluticasone  (FLONASE ) 50 MCG/ACT nasal spray Place 2 sprays  into both nostrils daily. 09/17/23   [name], [name], NP  Glucose Blood (BLOOD GLUCOSE TEST STRIPS) STRP 1 each by In Vitro route in the morning, at noon, and at bedtime. May substitute to any manufacturer covered by patient's insurance. 09/17/23   [name], [name], NP  metFORMIN  (GLUCOPHAGE ) 1000 MG tablet Take 1 tablet (1,000 mg total) by mouth 2 (two) times daily with a meal. 09/27/23   [name], [name], NP    Physical Exam: Vitals:   03/16/24 0437 03/16/24 0439  BP: 104/75   Pulse: 97   Resp: 18   Temp: 97.7 F (36.5 C)   TempSrc: Oral    SpO2: 98%   Weight:  77 kg     Constitutional: NAD, calm, comfortable Eyes: PERRL, lids and conjunctivae normal ENMT: Mucous membranes are moist. Posterior pharynx clear of any exudate or lesions.Normal dentition.  Neck: normal, supple, no masses, no thyromegaly Respiratory: clear to auscultation bilaterally, no wheezing, no crackles. Normal respiratory effort. No accessory muscle use.  Cardiovascular: Regular rate and rhythm, no murmurs / rubs / gallops. No extremity edema. 2+ pedal pulses. No carotid bruits.  Abdomen: no tenderness, no masses palpated. No hepatosplenomegaly. Bowel sounds positive.  Musculoskeletal: no clubbing / cyanosis. No joint deformity upper and lower extremities. Good ROM, no contractures. Normal muscle tone.  Skin: no rashes, lesions, ulcers. No induration Neurologic: CN 2-12 grossly intact. Sensation intact, DTR normal. Strength 5/5 in all 4.  Psychiatric: Normal judgment and insight. Alert and oriented x 3. Normal mood.   Data Reviewed:    Assessment and Plan:  Uncontrolled diabetes mellitus type 2 with hyperosmolar nonketotic hyperglycemia On admission patient's glucose noted to be elevated at 904 without elevated anion gap and serum osmolarity 343.  Records note patient's last hemoglobin A1c was noted to be greater than 15.5 when checked on 01/31/2024.  Patient had been bolused with over 3L of IV fluids and started on insulin  drip. He was not on any medications for treatment. - Admit to a progressive bed - Continue insulin  drip per protocol - IV fluids - Correct electrolyte abnormalities - Diabetic education consulted, will follow-up for any further recommendations - Transitions of care consulted  Fall Patient presents after having a fall hitting his head cause of fall is not totally clear.  Patient initially declined CT imaging of the head and cervical spine, but after repeat questioning he agreed.  Open imaging studies did not note any acute  abnormality some soft tissue swelling of the parieto-occipital region.  Unclear if patient has a concussion as the reason for his amnesia surrounding the fall or what specifically led to the fall. - Check orthostatic vital signs - PT to eval and treat - Follow-up telemetry overnight  Hypertension Patient reports not being on any blood pressure medications with blood pressure well-controlled at this time. - Monitor  Transaminitis Labs significant for alkaline phosphatase 148, AST 87, ALT 140.  Suspect secondary to possible dehydration and/or alcohol use. - Recheck LFTs in a.m.  Alcohol intoxication Patient reports that he does not normally drink on a daily basis, but alcohol level was noted to be elevated 142. - Counseled on need avoidance of alcohol use. - CIWA protocols initiated with Ativan  as needed  DVT prophylaxis: SCDs Advance Care Planning:   Code Status: Full Code    Consults: None  Family Communication: Patient's sister updated over the phone  Severity of Illness: The appropriate patient status for this patient is INPATIENT. Inpatient status is judged to  be reasonable and necessary in order to provide the required intensity of service to ensure the patient's safety. The patient's presenting symptoms, physical exam findings, and initial radiographic and laboratory data in the context of their chronic comorbidities is felt to place them at high risk for further clinical deterioration. Furthermore, it is not anticipated that the patient will be medically stable for discharge from the hospital within 2 midnights of admission.   * I certify that at the point of admission it is my clinical judgment that the patient will require inpatient hospital care spanning beyond 2 midnights from the point of admission due to high intensity of service, high risk for further deterioration and high frequency of surveillance required.*  Author: Maximino DELENA Sharps, MD 03/16/2024 7:52 AM  For on call  review [email or web address].

## 2024-03-17 ENCOUNTER — Other Ambulatory Visit (HOSPITAL_COMMUNITY): Payer: Self-pay

## 2024-03-17 ENCOUNTER — Ambulatory Visit: Payer: Commercial Managed Care - PPO | Admitting: Family

## 2024-03-17 LAB — LIPID PANEL
Cholesterol: 112 mg/dL (ref 0–200)
HDL: 49 mg/dL (ref >40)
LDL Cholesterol: 47 mg/dL (ref 0–99)
Total CHOL/HDL Ratio: 2.3 ratio
Triglycerides: 79 mg/dL (ref <150)
VLDL: 16 mg/dL (ref 0–40)

## 2024-03-17 LAB — BASIC METABOLIC PANEL WITH GFR
Anion gap: 7 (ref 5–15)
BUN: <5 mg/dL — ABNORMAL LOW (ref 6–20)
CO2: 26 mmol/L (ref 22–32)
Calcium: 8.4 mg/dL — ABNORMAL LOW (ref 8.9–10.3)
Chloride: 108 mmol/L (ref 98–111)
Creatinine, Ser: 0.47 mg/dL — ABNORMAL LOW (ref 0.61–1.24)
GFR, Estimated: >60 mL/min (ref >60)
Glucose, Bld: 73 mg/dL (ref 70–99)
Potassium: 2.8 mmol/L — ABNORMAL LOW (ref 3.5–5.1)
Sodium: 141 mmol/L (ref 135–145)

## 2024-03-17 LAB — CBC
HCT: 37 % — ABNORMAL LOW (ref 39.0–52.0)
Hemoglobin: 12.9 g/dL — ABNORMAL LOW (ref 13.0–17.0)
MCH: 31.2 pg (ref 26.0–34.0)
MCHC: 34.9 g/dL (ref 30.0–36.0)
MCV: 89.6 fL (ref 80.0–100.0)
Platelets: 155 K/uL (ref 150–400)
RBC: 4.13 MIL/uL — ABNORMAL LOW (ref 4.22–5.81)
RDW: 12.9 % (ref 11.5–15.5)
WBC: 6.5 K/uL (ref 4.0–10.5)
nRBC: 0 % (ref 0.0–0.2)

## 2024-03-17 LAB — GLUCOSE, CAPILLARY
Glucose-Capillary: 112 mg/dL — ABNORMAL HIGH (ref 70–99)
Glucose-Capillary: 137 mg/dL — ABNORMAL HIGH (ref 70–99)
Glucose-Capillary: 150 mg/dL — ABNORMAL HIGH (ref 70–99)
Glucose-Capillary: 182 mg/dL — ABNORMAL HIGH (ref 70–99)
Glucose-Capillary: 183 mg/dL — ABNORMAL HIGH (ref 70–99)
Glucose-Capillary: 321 mg/dL — ABNORMAL HIGH (ref 70–99)
Glucose-Capillary: 59 mg/dL — ABNORMAL LOW (ref 70–99)
Glucose-Capillary: 82 mg/dL (ref 70–99)
Glucose-Capillary: 93 mg/dL (ref 70–99)

## 2024-03-17 LAB — RENAL FUNCTION PANEL
Albumin: 2.5 g/dL — ABNORMAL LOW (ref 3.5–5.0)
Anion gap: 5 (ref 5–15)
BUN: <5 mg/dL — ABNORMAL LOW (ref 6–20)
CO2: 28 mmol/L (ref 22–32)
Calcium: 8.1 mg/dL — ABNORMAL LOW (ref 8.9–10.3)
Chloride: 102 mmol/L (ref 98–111)
Creatinine, Ser: 0.68 mg/dL (ref 0.61–1.24)
GFR, Estimated: >60 mL/min (ref >60)
Glucose, Bld: 270 mg/dL — ABNORMAL HIGH (ref 70–99)
Phosphorus: 3.1 mg/dL (ref 2.5–4.6)
Potassium: 4.2 mmol/L (ref 3.5–5.1)
Sodium: 135 mmol/L (ref 135–145)

## 2024-03-17 LAB — HEMOGLOBIN A1C
Hgb A1c MFr Bld: >15.5 % — ABNORMAL HIGH (ref 4.8–5.6)
Mean Plasma Glucose: >398 mg/dL

## 2024-03-17 LAB — MAGNESIUM: Magnesium: 1.7 mg/dL (ref 1.7–2.4)

## 2024-03-17 MED ORDER — ENOXAPARIN SODIUM 40 MG/0.4ML IJ SOSY
40.0000 mg | PREFILLED_SYRINGE | INTRAMUSCULAR | Status: DC
  Administered 2024-03-17: 40 mg via SUBCUTANEOUS
  Filled 2024-03-17: qty 0.4

## 2024-03-17 MED ORDER — MAGNESIUM SULFATE 2 GM/50ML IV SOLN
2.0000 g | Freq: Once | INTRAVENOUS | Status: AC
  Administered 2024-03-17: 2 g via INTRAVENOUS
  Filled 2024-03-17: qty 50

## 2024-03-17 MED ORDER — DEXTROSE 50 % IV SOLN
12.5000 g | Freq: Once | INTRAVENOUS | Status: AC
  Administered 2024-03-17: 12.5 g via INTRAVENOUS
  Filled 2024-03-17: qty 50

## 2024-03-17 MED ORDER — INSULIN ASPART 100 UNIT/ML IJ SOLN
3.0000 [IU] | Freq: Three times a day (TID) | INTRAMUSCULAR | Status: DC
  Administered 2024-03-17 – 2024-03-18 (×3): 3 [IU] via SUBCUTANEOUS

## 2024-03-17 MED ORDER — INSULIN ASPART 100 UNIT/ML IJ SOLN: 0.0000 [IU] | Freq: Every day | INTRAMUSCULAR | Status: DC

## 2024-03-17 MED ORDER — ATORVASTATIN CALCIUM 10 MG PO TABS
10.0000 mg | ORAL_TABLET | Freq: Every day | ORAL | Status: DC
  Administered 2024-03-17: 10 mg via ORAL
  Filled 2024-03-17: qty 1

## 2024-03-17 MED ORDER — POTASSIUM CHLORIDE CRYS ER 20 MEQ PO TBCR
40.0000 meq | EXTENDED_RELEASE_TABLET | Freq: Once | ORAL | Status: AC
Start: 2024-03-17 — End: 2024-03-17
  Administered 2024-03-17: 40 meq via ORAL
  Filled 2024-03-17: qty 2

## 2024-03-17 MED ORDER — POTASSIUM CHLORIDE CRYS ER 20 MEQ PO TBCR
40.0000 meq | EXTENDED_RELEASE_TABLET | Freq: Once | ORAL | Status: AC
  Administered 2024-03-17: 40 meq via ORAL
  Filled 2024-03-17: qty 2

## 2024-03-17 MED ORDER — POTASSIUM CHLORIDE 10 MEQ/100ML IV SOLN
10.0000 meq | INTRAVENOUS | Status: AC
  Administered 2024-03-17 (×2): 10 meq via INTRAVENOUS
  Filled 2024-03-17 (×2): qty 100

## 2024-03-17 MED ORDER — INSULIN ASPART PROT & ASPART (70-30 MIX) 100 UNIT/ML ~~LOC~~ SUSP
7.0000 [IU] | Freq: Two times a day (BID) | SUBCUTANEOUS | Status: DC
  Administered 2024-03-17 – 2024-03-18 (×2): 7 [IU] via SUBCUTANEOUS
  Filled 2024-03-17: qty 10

## 2024-03-17 MED ORDER — INSULIN ASPART 100 UNIT/ML IJ SOLN
0.0000 [IU] | Freq: Three times a day (TID) | INTRAMUSCULAR | Status: DC
  Administered 2024-03-17: 11 [IU] via SUBCUTANEOUS
  Administered 2024-03-17: 2 [IU] via SUBCUTANEOUS
  Administered 2024-03-18: 3 [IU] via SUBCUTANEOUS

## 2024-03-17 NOTE — Progress Notes (Signed)
 SLP Cancellation Note  Patient Details Name: Karl Torres MRN: [id number] DOB: 1976-03-26   Cancelled treatment:       Reason Eval/Treat Not Completed: SLP screened, CTH negative and pt reports he is at his baseline. No needs identified, will sign off.   Damien Blumenthal, M.A., CCC-SLP Speech Language Pathology, Acute Rehabilitation Services  Secure Chat preferred [phone]  03/17/2024, 1:13 PM

## 2024-03-17 NOTE — Progress Notes (Signed)
 Nutrition Education Note  RD consulted for nutrition education regarding diabetes.   Lab Results  Component Value Date   HGBA1C >15.5 (H) 03/16/2024    RD provided Carbohydrate Counting for People with Diabetes handout from the Academy of [employer]etics. Discussed different food groups and their effects on blood sugar, emphasizing carbohydrate-containing foods. Provided list of carbohydrates and recommended serving sizes of common foods.  He reports consuming quite a bit of sugar via beverages and snacks. Notes he has known about diabetes diagnosis since 2020. Had initially made dietary changes, but did not sustain them. Discussed risks associated with consistently high blood sugars and no behavior change. Already verbalizing some education received. Knows potatoes are considered a carbohydrate and that he needs a good protein source with meals and snacks. He seems motivated to change. Encouraged small, sustainable lifestyle modifications to promote long term behavior change.  Discussed importance of controlled and consistent carbohydrate intake throughout the day. Provided examples of ways to balance meals/snacks and encouraged intake of high-fiber, whole grain complex carbohydrates. Teach back method used.  Expect questionable compliance due to barriers to affording insulin  and making it to MD appointments, not for lack of motivation.  Body mass index is 23 kg/m. Pt meets criteria for within normative standards based on current BMI. Appears with adequate weight to frame.   Weight history a bit varied. Has shown 4.6% weight loss in last six months, which is not considered clinically significant for the time frame. Noted with a 6.5% weight increase in two weeks between 09/17/2023 to 09/27/2023. Difficult to assess true body weight trends.   Current diet order is carbohydrate modified, patient is consuming approximately 100% of meals at this time. Labs and medications reviewed. No  further nutrition interventions warranted at this time. RD contact information provided. If additional nutrition issues arise, please re-consult RD.  Blair Deaner MS, RD, LDN Registered Dietitian Clinical Nutrition RD Inpatient Contact Info in Amion

## 2024-03-17 NOTE — TOC Progression Note (Signed)
 Transition of Care [facility]) - Progression Note    Patient Details  Name: Karl Torres MRN: [id number] Date of Birth: [birth year]  Transition of [facility]) CM/SW Contact  Andrez JULIANNA George, RN Phone Number: 03/17/2024, 4:06 PM  Clinical Narrative:     CM attempted to get pt appt with PCP but office closed. Information on AVS.   Expected Discharge Plan: Home/Self Care Barriers to Discharge: Continued Medical Work up               Expected Discharge Plan and Services       Living arrangements for the past 2 months: Single Family Home                                       Social Drivers of Health (SDOH) Interventions SDOH Screenings   Food Insecurity: Food Insecurity Present (03/16/2024)  Housing: High Risk (03/16/2024)  Transportation Needs: Unmet Transportation Needs (03/16/2024)  Utilities: At Risk (03/16/2024)  Depression (PHQ2-9): Low Risk  (09/27/2023)  Tobacco Use: High Risk (03/16/2024)    Readmission Risk Interventions     No data to display

## 2024-03-17 NOTE — Plan of Care (Signed)

## 2024-03-17 NOTE — Progress Notes (Addendum)
 Pt's CBG was 59 @ 0425, graham crackers with peanut butter given. Follow-up CBG was 137. Pt's K+ was 2.8 on a.m. labs, attempted to notify Opyd, MD. See new orders.  Karl Torres Lyme, RN

## 2024-03-17 NOTE — Progress Notes (Signed)
 Mobility Specialist Progress Note;   03/17/24 0925  Mobility  Activity Ambulated independently in hallway  Level of Assistance Standby assist, set-up cues, supervision of patient - no hands on  Assistive Device Other (Comment) (IV pole)  Distance Ambulated (ft) 400 ft  Activity Response Tolerated well  Mobility Referral Yes  Mobility visit 1 Mobility  Mobility Specialist Start Time (ACUTE ONLY) [id number]  Mobility Specialist Stop Time (ACUTE ONLY) 0933  Mobility Specialist Time Calculation (min) (ACUTE ONLY) 8 min   Pt agreeable to mobility. Required no physical assistance during ambulation, SV. VSS throughout and no c/o when asked. Pt returned back to bed and left with all needs met, call bell in reach.   Lauraine Erm Mobility Specialist Please contact via SecureChat or [employer] [phone]

## 2024-03-17 NOTE — Progress Notes (Signed)
 PROGRESS NOTE  Karl Torres FMW:[id number] DOB: 1976-06-10   PCP: Pcp, No  Patient is from: Home.  DOA: 03/16/2024 LOS: 0  Chief complaints Chief Complaint  Patient presents with   Fall   Hyperglycemia     Brief Narrative / Interim history: 48 year old M with PMH of DM-2, HTN, HLD, tobacco use disorder and alcohol abuse brought to ED after he woke up on floor at home.  Reportedly went home for work, and then woke up on the floor to a couple of people around him fighting.  Does not recall any prodromes.  In ED, stable vitals.  Glucose elevated to 904.  AST 87.  ALT 140.  BHA 0.17.  EtOH level 142.  CT head and cervical spine showed diffuse soft tissue swelling of scalp particularly in parieto-occipital region.  Right elbow and chest x-ray without acute finding.  Started on IV fluid, insulin  drip and potassium supplementation and admitted for further evaluation.   Patient does not take any of his medications.     Subjective: Seen and examined earlier this morning.  Hypoglycemic to 59 earlier this morning.  Otherwise, no complaints.  A1c > 15.5%.  Objective: Vitals:   03/16/24 2330 03/17/24 0302 03/17/24 0744 03/17/24 1125  BP: 106/70 90/62 112/84 111/81  Pulse:  72 60 77  Resp: 15  18   Temp: 98 F (36.7 C) 98.4 F (36.9 C) 97.9 F (36.6 C) 98.4 F (36.9 C)  TempSrc: Oral Oral Oral Oral  SpO2: 97% 99% 98% 95%  Weight:      Height:        Examination:  GENERAL: No apparent distress.  Nontoxic. HEENT: MMM.  Vision and hearing grossly intact.  NECK: Supple.  No apparent JVD.  RESP:  No IWOB.  Fair aeration bilaterally. CVS:  RRR. Heart sounds normal.  ABD/GI/GU: BS+. Abd soft, NTND.  MSK/EXT:  Moves extremities. No apparent deformity. No edema.  SKIN: no apparent skin lesion or wound NEURO: AA.  Oriented appropriately.  No apparent focal neuro deficit. PSYCH: Calm. Normal affect.   Consultants:  Diabetic coordinator  Procedures: None  Microbiology  summarized: None  Assessment and plan: Uncontrolled NIDDM-2 with HHS, hyperglycemia and level 1 hypoglycemia: A1c > 15.5%.  Patient does not take any medication at home. Recent Labs  Lab 03/17/24 0008 03/17/24 0425 03/17/24 0455 03/17/24 0747 03/17/24 1129  GLUCAP 112* 59* 137* 182* 321*  -Increase SSI to moderate -Add NovoLog  3 units 3 times daily with meals -Changed Semglee  to 70/30 at 7 units twice daily starting this evening -Check lipid panel -Further adjustment as appropriate. -Appreciate input by diabetic coordinator   Fall at home: Unknown mechanism of fall but he was very hypoglycemic with EtOH level of 142 on arrival.  No focal neurodeficit.  CT head showed soft tissue swelling of the parieto-occipital region.  Has no focal neurodeficit.  Orthostatic vitals negative.  No events on EKG or telemetry. -PT/OT eval   Alcohol intoxication: EtOH level 142 on admission.  No withdrawal symptoms.  Denies history of seizure or DVT.  - Encouraged alcohol cessation -CIWA with as needed Ativan  -Multivitamin, thiamine  and folic acid  -TOC consulted  Tobacco use disorder: Reports smoking about half a pack a day. -Encouraged smoking cessation -Continue nicotine  patch  Hypertension: Normotensive without antihypertensive meds.   Transaminitis: Likely from alcohol. -Monitor  Hypokalemia -Monitor replenish K and Mg as appropriate    Body mass index is 23 kg/m.  DVT prophylaxis:  SCDs Start: 03/16/24 0802  Code Status: Full code Family Communication: None at bedside Level of care: Med-Surg Status is: Observation The patient will require care spanning > 2 midnights and should be moved to inpatient because: Better glycemic control with initiation of insulin .   Final disposition: Home   55 minutes with more than 50% spent in reviewing records, counseling patient/family and coordinating care.   Sch Meds:  Scheduled Meds:  atorvastatin   10 mg Oral Daily    bacitracin    Topical BID   folic acid   1 mg Oral Daily   insulin  aspart  0-9 Units Subcutaneous Q4H   insulin  aspart protamine- aspart  7 Units Subcutaneous BID WC   insulin  starter kit- pen needles  1 kit Other Once   living well with diabetes book   Does not apply Once   multivitamin with minerals  1 tablet Oral Daily   nicotine   14 mg Transdermal Daily   sodium chloride  flush  3 mL Intravenous Q12H   thiamine   100 mg Oral Daily   Or   thiamine   100 mg Intravenous Daily   Continuous Infusions: PRN Meds:.acetaminophen  **OR** acetaminophen , albuterol , dextrose , LORazepam  **OR** LORazepam   Antimicrobials: Anti-infectives (From admission, onward)    None        I have personally reviewed the following labs and images: CBC: Recent Labs  Lab 03/16/24 0443 03/16/24 0453 03/16/24 0557 03/17/24 0224  WBC 5.5  --   --  6.5  HGB 14.9 15.6 15.6 12.9*  HCT 42.9 46.0 46.0 37.0*  MCV 91.5  --   --  89.6  PLT 213  --   --  155   BMP &GFR Recent Labs  Lab 03/16/24 0453 03/16/24 0556 03/16/24 0557 03/16/24 1543 03/16/24 1956 03/17/24 0224  NA 135 135 136 140 138 141  K 3.5 3.5 3.5 4.1 3.3* 2.8*  CL 98 98  --  103 107 108  CO2  --  22  --  31 26 26   GLUCOSE >700* 904*  --  293* 129* 73  BUN <3* <5*  --  <5* 5* <5*  CREATININE 0.60* 0.81  --  0.60* 0.55* 0.47*  CALCIUM   --  8.6*  --  8.3* 8.0* 8.4*  MG  --   --   --   --   --  1.7   Estimated Creatinine Clearance: 108.1 mL/min (A) (by C-G formula based on SCr of 0.47 mg/dL (L)). Liver & Pancreas: Recent Labs  Lab 03/16/24 0556  AST 87*  ALT 140*  ALKPHOS 148*  BILITOT 0.6  PROT 5.9*  ALBUMIN 3.3*   No results for input(s): LIPASE, AMYLASE in the last 168 hours. No results for input(s): AMMONIA in the last 168 hours. Diabetic: Recent Labs    03/16/24 0800  HGBA1C >15.5*   Recent Labs  Lab 03/17/24 0008 03/17/24 0425 03/17/24 0455 03/17/24 0747 03/17/24 1129  GLUCAP 112* 59* 137* 182* 321*    Cardiac Enzymes: Recent Labs  Lab 03/16/24 0941  CKTOTAL 78   No results for input(s): PROBNP in the last 8760 hours. Coagulation Profile: No results for input(s): INR, PROTIME in the last 168 hours. Thyroid  Function Tests: No results for input(s): TSH, T4TOTAL, FREET4, T3FREE, THYROIDAB in the last 72 hours. Lipid Profile: Recent Labs    03/17/24 0224  CHOL 112  HDL 49  LDLCALC 47  TRIG 79  CHOLHDL 2.3   Anemia Panel: No results for input(s): VITAMINB12, FOLATE, FERRITIN, TIBC, IRON,  RETICCTPCT in the last 72 hours. Urine analysis:    Component Value Date/Time   COLORURINE YELLOW 03/16/2024 2313   APPEARANCEUR CLEAR 03/16/2024 2313   LABSPEC 1.009 03/16/2024 2313   PHURINE 6.0 03/16/2024 2313   GLUCOSEU 50 (A) 03/16/2024 2313   HGBUR NEGATIVE 03/16/2024 2313   BILIRUBINUR NEGATIVE 03/16/2024 2313   BILIRUBINUR negative 08/22/2023 1248   KETONESUR NEGATIVE 03/16/2024 2313   PROTEINUR NEGATIVE 03/16/2024 2313   UROBILINOGEN 0.2 08/22/2023 1248   NITRITE NEGATIVE 03/16/2024 2313   LEUKOCYTESUR NEGATIVE 03/16/2024 2313   Sepsis Labs: Invalid input(s): PROCALCITONIN, LACTICIDVEN  Microbiology: Recent Results (from the past 240 hours)  MRSA Next Gen by PCR, Nasal     Status: None   Collection Time: 03/16/24  3:57 PM   Specimen: Nasal Mucosa; Nasal Swab  Result Value Ref Range Status   MRSA by PCR Next Gen NOT DETECTED NOT DETECTED Final    Comment: (NOTE) The GeneXpert MRSA Assay (FDA approved for NASAL specimens only), is one component of a comprehensive MRSA colonization surveillance program. It is not intended to diagnose MRSA infection nor to guide or monitor treatment for MRSA infections. Test performance is not FDA approved in patients less than 40 years old. Performed at [facility], [address], [place], [place] [address]     Radiology Studies: CT Head Wo Contrast Result Date: 03/16/2024 CLINICAL DATA:   Head trauma, fall, hit back of head. EXAM: CT HEAD WITHOUT CONTRAST TECHNIQUE: Contiguous axial images were obtained from the base of the skull through the vertex without intravenous contrast. RADIATION DOSE REDUCTION: This exam was performed according to the departmental dose-optimization program which includes automated exposure control, adjustment of the mA and/or kV according to patient size and/or use of iterative reconstruction technique. COMPARISON:  CT head 01/25/2022. FINDINGS: Brain: No acute intracranial hemorrhage. No CT evidence of acute infarct. No edema, mass effect, or midline shift. The basilar cisterns are patent. Ventricles: The ventricles are normal. Vascular: No hyperdense vessel or unexpected calcification. Skull: No acute or aggressive finding. Orbits: Orbits are symmetric. Sinuses: Mild mucosal thickening in the bilateral maxillary sinuses. Other: Diffuse soft tissue swelling of the scalp particularly over the bilateral parieto-occipital scalp. IMPRESSION: No CT evidence of acute intracranial abnormality. Diffuse soft tissue swelling of the scalp particularly in the parieto-occipital region. Electronically Signed   By: Donnice Mania M.D.   On: 03/16/2024 14:23   CT Cervical Spine Wo Contrast Result Date: 03/16/2024 CLINICAL DATA:  Neck trauma, dangerous injury mechanism (Age 36-64y) EXAM: CT CERVICAL SPINE WITHOUT CONTRAST TECHNIQUE: Multidetector CT imaging of the cervical spine was performed without intravenous contrast. Multiplanar CT image reconstructions were also generated. RADIATION DOSE REDUCTION: This exam was performed according to the departmental dose-optimization program which includes automated exposure control, adjustment of the mA and/or kV according to patient size and/or use of iterative reconstruction technique. COMPARISON:  None Available. FINDINGS: Alignment: Normal. Skull base and vertebrae: Negative. Soft tissues and spinal canal: No soft tissue hematoma or focal  soft tissue swelling. Disc levels: There is mild disc space narrowing and endplate ridging at C5-6, with mild central spinal canal stenosis. The neural foramina are patent. The other disc space levels are unremarkable. Upper chest: The lung apices are clear. Other: None. IMPRESSION: 1. 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 No evidence of acute traumatic injury. Electronically Signed   By: Evalene Coho M.D.   On: 03/16/2024 14:14      Paxson Harrower T. Aarik Blank [facility]  If 7PM-7AM, please contact night-coverage [email or web address] 03/17/2024, 11:45 AM

## 2024-03-17 NOTE — Inpatient Diabetes Management (Signed)
 Inpatient Diabetes Program Recommendations  AACE/ADA: New Consensus Statement on Inpatient Glycemic Control   Target Ranges:  Prepandial:   less than 140 mg/dL      Peak postprandial:   less than 180 mg/dL (1-2 hours)      Critically ill patients:  140 - 180 mg/dL    Latest Reference Range & Units 03/17/24 00:08 03/17/24 04:25 03/17/24 04:55 03/17/24 07:47  Glucose-Capillary 70 - 99 mg/dL 887 (H) 59 (L) 862 (H) 182 (H)    Latest Reference Range & Units 03/16/24 09:23 03/16/24 10:25 03/16/24 11:27 03/16/24 12:32 03/16/24 14:00 03/16/24 15:41 03/16/24 21:05 03/16/24 21:35  Glucose-Capillary 70 - 99 mg/dL 662 (H) 739 (H) 812 (H) 127 (H) 92 294 (H) 65 (L) 97    Latest Reference Range & Units 03/16/24 04:53 03/16/24 05:56 03/16/24 15:43 03/16/24 19:56  Glucose 70 - 99 mg/dL >299 (HH) 095 (HH) 706 (H) 129 (H)   Review of Glycemic Control  Diabetes history: DM2 Outpatient Diabetes medications: Metformin  500 mg BID (not taking) Current orders for Inpatient glycemic control: Semglee  12 units Q24H, Novolog  0-9 units Q4H  Inpatient Diabetes Program Recommendations:    Insulin : Patient received Semglee  15 units at 14:14 on 03/16/24 and has experienced hypoglycemia x2 (CBG 65 mg/dl at 78:94 on 2/75 and 59 mg/dl at 5:74 am today). Noted Semglee  decreased from 15 to 12 units Q24H.  Since patient will likely need to discharge on affordable insulin , please consider discontinuing Semglee  and ordering 70/30 7 units BID (dose will provide a total of 9.8 units for basal and 4.2 units for meal coverage per day) to start with supper today.   Outpatient DM: At time of discharge, if patient is discharged on insulin  please provide Rx for Humulin 70/30 insulin  pens (#[id number]) and insulin  pen needles [phone]).  Thanks, Earnie Gainer, RN, MSN, CDCES Diabetes Coordinator Inpatient Diabetes Program [phone] (Team Pager from 8am to 5pm)

## 2024-03-17 NOTE — Inpatient Diabetes Management (Signed)
 Inpatient Diabetes Program Recommendations  AACE/ADA: New Consensus Statement on Inpatient Glycemic Control (2015)  Target Ranges:  Prepandial:   less than 140 mg/dL      Peak postprandial:   less than 180 mg/dL (1-2 hours)      Critically ill patients:  140 - 180 mg/dL   Lab Results  Component Value Date   GLUCAP 321 (H) 03/17/2024   HGBA1C >15.5 (H) 03/16/2024    Provided patient with a [employer] ReliOn Glucometer as he is uninsured and will be discharging on insulin .  We discussed to check CBG's ac/hs and when he feels symptomatic.  He should bring his glucometer to his PCP visits for MD to review trends.  He can obtain more strips and lancets at [employer].  Patient is thankful and verbalizes understanding.    Thank you, Wyvonna Pinal, MSN, CDCES Diabetes Coordinator Inpatient Diabetes Program [phone] (team pager from 8a-5p)

## 2024-03-18 ENCOUNTER — Encounter: Payer: Self-pay | Admitting: Student

## 2024-03-18 ENCOUNTER — Other Ambulatory Visit (HOSPITAL_COMMUNITY): Payer: Self-pay

## 2024-03-18 LAB — MAGNESIUM: Magnesium: 1.8 mg/dL (ref 1.7–2.4)

## 2024-03-18 LAB — GLUCOSE, CAPILLARY
Glucose-Capillary: 201 mg/dL — ABNORMAL HIGH (ref 70–99)
Glucose-Capillary: 177 mg/dL — ABNORMAL HIGH (ref 70–99)
Glucose-Capillary: 341 mg/dL — ABNORMAL HIGH (ref 70–99)

## 2024-03-18 LAB — COMPREHENSIVE METABOLIC PANEL WITH GFR
ALT: 85 U/L — ABNORMAL HIGH (ref 0–44)
AST: 58 U/L — ABNORMAL HIGH (ref 15–41)
Albumin: 2.4 g/dL — ABNORMAL LOW (ref 3.5–5.0)
Alkaline Phosphatase: 81 U/L (ref 38–126)
Anion gap: 6 (ref 5–15)
BUN: <5 mg/dL — ABNORMAL LOW (ref 6–20)
CO2: 27 mmol/L (ref 22–32)
Calcium: 8 mg/dL — ABNORMAL LOW (ref 8.9–10.3)
Chloride: 104 mmol/L (ref 98–111)
Creatinine, Ser: 0.65 mg/dL (ref 0.61–1.24)
GFR, Estimated: >60 mL/min (ref >60)
Glucose, Bld: 196 mg/dL — ABNORMAL HIGH (ref 70–99)
Potassium: 4.2 mmol/L (ref 3.5–5.1)
Sodium: 137 mmol/L (ref 135–145)
Total Bilirubin: 0.6 mg/dL (ref 0.0–1.2)
Total Protein: 4.3 g/dL — ABNORMAL LOW (ref 6.5–8.1)

## 2024-03-18 LAB — CBC
HCT: 38.5 % — ABNORMAL LOW (ref 39.0–52.0)
Hemoglobin: 13.2 g/dL (ref 13.0–17.0)
MCH: 31.4 pg (ref 26.0–34.0)
MCHC: 34.3 g/dL (ref 30.0–36.0)
MCV: 91.4 fL (ref 80.0–100.0)
Platelets: 139 K/uL — ABNORMAL LOW (ref 150–400)
RBC: 4.21 MIL/uL — ABNORMAL LOW (ref 4.22–5.81)
RDW: 13.1 % (ref 11.5–15.5)
WBC: 5.9 K/uL (ref 4.0–10.5)
nRBC: 0 % (ref 0.0–0.2)

## 2024-03-18 MED ORDER — METFORMIN HCL 1000 MG PO TABS
1000.0000 mg | ORAL_TABLET | Freq: Two times a day (BID) | ORAL | 3 refills | Status: DC
  Filled 2024-03-18: qty 180, 90d supply, fill #0

## 2024-03-18 MED ORDER — THIAMINE HCL 100 MG PO TABS
100.0000 mg | ORAL_TABLET | Freq: Every day | ORAL | 0 refills | Status: DC
  Filled 2024-03-18: qty 30, 30d supply, fill #0

## 2024-03-18 MED ORDER — ADULT MULTIVITAMIN W/MINERALS CH: 1.0000 | ORAL_TABLET | Freq: Every day | ORAL | Status: AC

## 2024-03-18 MED ORDER — INSULIN PEN NEEDLE 32G X 4 MM MISC
1.0000 | Freq: Two times a day (BID) | 0 refills | Status: DC
  Filled 2024-03-18: qty 100, 30d supply, fill #0

## 2024-03-18 MED ORDER — INSULIN ISOPHANE & REGULAR (HUMAN 70-30)100 UNIT/ML KWIKPEN
10.0000 [IU] | PEN_INJECTOR | Freq: Two times a day (BID) | SUBCUTANEOUS | 11 refills | Status: DC
  Filled 2024-03-18: qty 6, 30d supply, fill #0

## 2024-03-18 MED ORDER — FOLIC ACID 1 MG PO TABS
1.0000 mg | ORAL_TABLET | Freq: Every day | ORAL | 0 refills | Status: DC
  Filled 2024-03-18: qty 30, 30d supply, fill #0

## 2024-03-18 MED ORDER — NICOTINE 14 MG/24HR TD PT24: 14.0000 mg | MEDICATED_PATCH | Freq: Every day | TRANSDERMAL | Status: AC

## 2024-03-18 NOTE — Progress Notes (Signed)
 Pt states he needs to leave room by 8:30 , RN reviewed discharge information with pt and verbally educated on insulin  pen. Pt was noncompliant with staying in room for physical instruction with insulin  pen.Pt refused to stay in room to receive TOC meds but states ill get them.  Pt non compliant with staying in room to call taxi. Pt has taxi one physical copy of voucher.  MD notified about noncompliance with discharge.

## 2024-03-18 NOTE — Plan of Care (Signed)
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
## 2024-03-21 ENCOUNTER — Telehealth: Payer: Self-pay | Admitting: Pharmacist

## 2024-03-21 DIAGNOSIS — E1100 Type 2 diabetes mellitus with hyperosmolarity without nonketotic hyperglycemic-hyperosmolar coma (NKHHC): Secondary | ICD-10-CM
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
## 2024-04-29 ENCOUNTER — Emergency Department (HOSPITAL_COMMUNITY): Payer: Self-pay

## 2024-04-29 ENCOUNTER — Encounter (HOSPITAL_COMMUNITY): Payer: Self-pay | Admitting: Emergency Medicine

## 2024-04-29 ENCOUNTER — Other Ambulatory Visit: Payer: Self-pay

## 2024-04-29 ENCOUNTER — Observation Stay (HOSPITAL_COMMUNITY)
Admission: EM | Admit: 2024-04-29 | Discharge: 2024-05-01 | Disposition: A | Discharge status: Home / Self Care | Payer: Self-pay | Attending: Emergency Medicine | Admitting: Emergency Medicine

## 2024-04-29 DIAGNOSIS — E871 Hypo-osmolality and hyponatremia: Secondary | ICD-10-CM | POA: Insufficient documentation

## 2024-04-29 DIAGNOSIS — K5289 Other specified noninfective gastroenteritis and colitis: Principal | ICD-10-CM | POA: Diagnosis present

## 2024-04-29 DIAGNOSIS — Z7982 Long term (current) use of aspirin: Secondary | ICD-10-CM | POA: Insufficient documentation

## 2024-04-29 DIAGNOSIS — Z87891 Personal history of nicotine dependence: Secondary | ICD-10-CM | POA: Insufficient documentation

## 2024-04-29 DIAGNOSIS — E1165 Type 2 diabetes mellitus with hyperglycemia: Secondary | ICD-10-CM | POA: Insufficient documentation

## 2024-04-29 DIAGNOSIS — Z7984 Long term (current) use of oral hypoglycemic drugs: Secondary | ICD-10-CM | POA: Insufficient documentation

## 2024-04-29 DIAGNOSIS — Z79899 Other long term (current) drug therapy: Secondary | ICD-10-CM | POA: Insufficient documentation

## 2024-04-29 DIAGNOSIS — E1142 Type 2 diabetes mellitus with diabetic polyneuropathy: Secondary | ICD-10-CM | POA: Diagnosis present

## 2024-04-29 DIAGNOSIS — K56609 Unspecified intestinal obstruction, unspecified as to partial versus complete obstruction: Secondary | ICD-10-CM | POA: Insufficient documentation

## 2024-04-29 DIAGNOSIS — E1140 Type 2 diabetes mellitus with diabetic neuropathy, unspecified: Secondary | ICD-10-CM | POA: Insufficient documentation

## 2024-04-29 DIAGNOSIS — I10 Essential (primary) hypertension: Secondary | ICD-10-CM | POA: Insufficient documentation

## 2024-04-29 DIAGNOSIS — K860 Alcohol-induced chronic pancreatitis: Secondary | ICD-10-CM | POA: Diagnosis present

## 2024-04-29 DIAGNOSIS — K5900 Constipation, unspecified: Secondary | ICD-10-CM | POA: Diagnosis present

## 2024-04-29 DIAGNOSIS — K862 Cyst of pancreas: Secondary | ICD-10-CM | POA: Diagnosis present

## 2024-04-29 DIAGNOSIS — Z794 Long term (current) use of insulin: Secondary | ICD-10-CM | POA: Insufficient documentation

## 2024-04-29 LAB — COMPREHENSIVE METABOLIC PANEL WITH GFR
ALT: 146 U/L — ABNORMAL HIGH (ref 0–44)
AST: 63 U/L — ABNORMAL HIGH (ref 15–41)
Albumin: 2.7 g/dL — ABNORMAL LOW (ref 3.5–5.0)
Alkaline Phosphatase: 102 U/L (ref 38–126)
Anion gap: 12 (ref 5–15)
BUN: 8 mg/dL (ref 6–20)
CO2: 24 mmol/L (ref 22–32)
Calcium: 7.7 mg/dL — ABNORMAL LOW (ref 8.9–10.3)
Chloride: 99 mmol/L (ref 98–111)
Creatinine, Ser: 0.7 mg/dL (ref 0.61–1.24)
GFR, Estimated: >60 mL/min (ref >60)
Glucose, Bld: 214 mg/dL — ABNORMAL HIGH (ref 70–99)
Potassium: 4.5 mmol/L (ref 3.5–5.1)
Sodium: 135 mmol/L (ref 135–145)
Total Bilirubin: 1.3 mg/dL — ABNORMAL HIGH (ref 0.0–1.2)
Total Protein: 4.5 g/dL — ABNORMAL LOW (ref 6.5–8.1)

## 2024-04-29 LAB — I-STAT CHEM 8, ED
BUN: 8 mg/dL (ref 6–20)
Calcium, Ion: 1.03 mmol/L — ABNORMAL LOW (ref 1.15–1.40)
Chloride: 97 mmol/L — ABNORMAL LOW (ref 98–111)
Creatinine, Ser: 0.8 mg/dL (ref 0.61–1.24)
Glucose, Bld: 213 mg/dL — ABNORMAL HIGH (ref 70–99)
HCT: 42 % (ref 39.0–52.0)
Hemoglobin: 14.3 g/dL (ref 13.0–17.0)
Potassium: 4.5 mmol/L (ref 3.5–5.1)
Sodium: 136 mmol/L (ref 135–145)
TCO2: 28 mmol/L (ref 22–32)

## 2024-04-29 LAB — CBC
HCT: 41.5 % (ref 39.0–52.0)
Hemoglobin: 14 g/dL (ref 13.0–17.0)
MCH: 30.6 pg (ref 26.0–34.0)
MCHC: 33.7 g/dL (ref 30.0–36.0)
MCV: 90.8 fL (ref 80.0–100.0)
Platelets: 184 K/uL (ref 150–400)
RBC: 4.57 MIL/uL (ref 4.22–5.81)
RDW: 12.3 % (ref 11.5–15.5)
WBC: 5.2 K/uL (ref 4.0–10.5)
nRBC: 0 % (ref 0.0–0.2)

## 2024-04-29 LAB — URINALYSIS, ROUTINE W REFLEX MICROSCOPIC
Bilirubin Urine: NEGATIVE
Glucose, UA: 150 mg/dL — AB
Hgb urine dipstick: NEGATIVE
Ketones, ur: 5 mg/dL — AB
Leukocytes,Ua: NEGATIVE
Nitrite: NEGATIVE
Protein, ur: 30 mg/dL — AB
Specific Gravity, Urine: 1.026 (ref 1.005–1.030)
pH: 5 (ref 5.0–8.0)

## 2024-04-29 LAB — CBG MONITORING, ED
Glucose-Capillary: 195 mg/dL — ABNORMAL HIGH (ref 70–99)
Glucose-Capillary: 518 mg/dL (ref 70–99)
Glucose-Capillary: 335 mg/dL — ABNORMAL HIGH (ref 70–99)
Glucose-Capillary: 278 mg/dL — ABNORMAL HIGH (ref 70–99)

## 2024-04-29 LAB — LIPASE, BLOOD: Lipase: 23 U/L (ref 11–51)

## 2024-04-29 LAB — ETHANOL: Alcohol, Ethyl (B): <15 mg/dL (ref <15)

## 2024-04-29 MED ORDER — ONDANSETRON HCL 4 MG/2ML IJ SOLN
4.0000 mg | Freq: Once | INTRAMUSCULAR | Status: AC
  Administered 2024-04-29: 4 mg via INTRAVENOUS
  Filled 2024-04-29: qty 2

## 2024-04-29 MED ORDER — INSULIN ASPART 100 UNIT/ML IJ SOLN
5.0000 [IU] | Freq: Once | INTRAMUSCULAR | Status: AC
  Administered 2024-04-29: 5 [IU] via SUBCUTANEOUS

## 2024-04-29 MED ORDER — LORAZEPAM 2 MG/ML IJ SOLN: 0.0000 mg | Freq: Four times a day (QID) | INTRAMUSCULAR | Status: DC

## 2024-04-29 MED ORDER — LORAZEPAM 1 MG PO TABS: 0.0000 mg | ORAL_TABLET | Freq: Two times a day (BID) | ORAL | Status: DC

## 2024-04-29 MED ORDER — THIAMINE MONONITRATE 100 MG PO TABS
100.0000 mg | ORAL_TABLET | Freq: Every day | ORAL | Status: DC
  Administered 2024-04-30 – 2024-05-01 (×2): 100 mg via ORAL
  Filled 2024-04-29 (×2): qty 1

## 2024-04-29 MED ORDER — SODIUM CHLORIDE 0.9 % IV SOLN: 1.0000 g | Freq: Once | INTRAVENOUS | Status: DC

## 2024-04-29 MED ORDER — MORPHINE SULFATE (PF) 4 MG/ML IV SOLN
4.0000 mg | Freq: Once | INTRAVENOUS | Status: AC
  Administered 2024-04-29: 4 mg via INTRAVENOUS
  Filled 2024-04-29: qty 1

## 2024-04-29 MED ORDER — LORAZEPAM 2 MG/ML IJ SOLN: 0.0000 mg | Freq: Two times a day (BID) | INTRAMUSCULAR | Status: DC

## 2024-04-29 MED ORDER — DOCUSATE SODIUM 100 MG PO CAPS
100.0000 mg | ORAL_CAPSULE | Freq: Every day | ORAL | Status: DC
  Administered 2024-04-29 – 2024-05-01 (×3): 100 mg via ORAL
  Filled 2024-04-29 (×3): qty 1

## 2024-04-29 MED ORDER — IOHEXOL 350 MG/ML SOLN
75.0000 mL | Freq: Once | INTRAVENOUS | Status: AC | PRN
  Administered 2024-04-29: 75 mL via INTRAVENOUS

## 2024-04-29 MED ORDER — SODIUM CHLORIDE 0.9 % IV SOLN
2.0000 g | Freq: Once | INTRAVENOUS | Status: AC
  Administered 2024-04-29: 2 g via INTRAVENOUS
  Filled 2024-04-29: qty 20

## 2024-04-29 MED ORDER — FOLIC ACID 1 MG PO TABS
1.0000 mg | ORAL_TABLET | Freq: Every day | ORAL | Status: DC
  Administered 2024-04-29 – 2024-05-01 (×3): 1 mg via ORAL
  Filled 2024-04-29 (×3): qty 1

## 2024-04-29 MED ORDER — THIAMINE HCL 100 MG/ML IJ SOLN
100.0000 mg | Freq: Once | INTRAMUSCULAR | Status: AC
  Administered 2024-04-29: 100 mg via INTRAVENOUS
  Filled 2024-04-29: qty 2

## 2024-04-29 MED ORDER — ACETAMINOPHEN 500 MG PO TABS
1000.0000 mg | ORAL_TABLET | Freq: Four times a day (QID) | ORAL | Status: DC | PRN
  Administered 2024-04-29: 1000 mg via ORAL
  Filled 2024-04-29: qty 2

## 2024-04-29 MED ORDER — SENNA 8.6 MG PO TABS
1.0000 | ORAL_TABLET | Freq: Every day | ORAL | Status: DC
  Administered 2024-04-29: 8.6 mg via ORAL
  Filled 2024-04-29: qty 1

## 2024-04-29 MED ORDER — LORAZEPAM 1 MG PO TABS: 0.0000 mg | ORAL_TABLET | Freq: Four times a day (QID) | ORAL | Status: DC

## 2024-04-29 MED ORDER — METRONIDAZOLE 500 MG/100ML IV SOLN
500.0000 mg | Freq: Once | INTRAVENOUS | Status: AC
  Administered 2024-04-29: 500 mg via INTRAVENOUS
  Filled 2024-04-29: qty 100

## 2024-04-29 MED ORDER — ENOXAPARIN SODIUM 40 MG/0.4ML IJ SOSY
40.0000 mg | PREFILLED_SYRINGE | INTRAMUSCULAR | Status: DC
  Administered 2024-04-29 – 2024-04-30 (×2): 40 mg via SUBCUTANEOUS
  Filled 2024-04-29 (×2): qty 0.4

## 2024-04-29 MED ORDER — ADULT MULTIVITAMIN W/MINERALS CH
1.0000 | ORAL_TABLET | Freq: Every day | ORAL | Status: DC
  Administered 2024-04-29 – 2024-05-01 (×3): 1 via ORAL
  Filled 2024-04-29 (×3): qty 1

## 2024-04-29 MED ORDER — SODIUM CHLORIDE 0.9 % IV BOLUS
1000.0000 mL | Freq: Once | INTRAVENOUS | Status: AC
  Administered 2024-04-29: 1000 mL via INTRAVENOUS

## 2024-04-29 MED ORDER — INSULIN ASPART 100 UNIT/ML IJ SOLN
0.0000 [IU] | Freq: Three times a day (TID) | INTRAMUSCULAR | Status: DC
  Administered 2024-04-30: 11 [IU] via SUBCUTANEOUS
  Administered 2024-04-30: 15 [IU] via SUBCUTANEOUS
  Administered 2024-04-30: 2 [IU] via SUBCUTANEOUS
  Administered 2024-05-01: 10 [IU] via SUBCUTANEOUS

## 2024-04-29 MED ORDER — THIAMINE HCL 100 MG/ML IJ SOLN: 100.0000 mg | Freq: Every day | INTRAMUSCULAR | Status: DC

## 2024-04-29 MED ORDER — POLYETHYLENE GLYCOL 3350 17 G PO PACK
17.0000 g | PACK | Freq: Every day | ORAL | Status: DC
  Administered 2024-04-29: 17 g via ORAL
  Filled 2024-04-29: qty 1

## 2024-04-29 NOTE — H&P (Cosign Needed Addendum)
 Date: 04/29/2024               Patient Name:  Karl Torres MRN: [id number]  DOB: 05-18-1976 Age / Sex: 48 y.o., male   PCP: Pcp, No         Medical Service: Internal Medicine Teaching Service         Attending Physician: Dr. Trudy, Mliss Dragon, MD      First Contact: Dr. Rebecka Pion, DO    Second Contact: Dr. Mayci Haning, MD          After Hours (After 5p/  First Contact Pager: [phone]  weekends / holidays): Second Contact Pager: [phone]   SUBJECTIVE   Chief Complaint: Abdominal Pain   History of Present Illness:   [name] is a 48 year old male with past medical history of type 2 diabetes, hypertension, chronic pancreatitis secondary to alcohol use who presented with right upper quadrant abdominal pain.  He states he woke up this morning and started having a sharp pain in his right quadrant.  It is not radiating denies any fevers, chills, chest pain.  He has recently been straining to have a bowel movement from his last full bowel movement.  He is not having any nausea, or vomiting, and has been able to tolerate PO intake. He states that his last drink was about two weeks ago. Has not taken any new medications besides his insulin  and metformin .   Meds:  Current Meds  Medication Sig   folic acid  (FOLVITE ) 1 MG tablet Take 1 tablet (1 mg total) by mouth daily.   insulin  isophane & regular human KwikPen (HUMULIN 70/30 MIX) (70-30) 100 UNIT/ML KwikPen Inject 10 Units into the skin 2 (two) times daily after breakfast and dinner   metFORMIN  (GLUCOPHAGE ) 1000 MG tablet Take 1 tablet (1,000 mg total) by mouth 2 (two) times daily with a meal.   thiamine  (VITAMIN B1) 100 MG tablet Take 1 tablet (100 mg total) by mouth daily.    History reviewed. No pertinent surgical history.  Social:  Lives With: in a motel Support: Friends in the area Level of Function: Independent in ADLs/IADLs PCP: NA Substances: 20+ pack year smoking history, occasionally drinks alcohol, last  drink two weeks ago, declines marijuana, heroine, cocaine use.   Family History: Unable to obtain  Allergies: Allergies as of 04/29/2024   (No Known Allergies)    Review of Systems: A complete ROS was negative except as per HPI.   OBJECTIVE:   Physical Exam: Blood pressure 106/72, pulse (!) 58, temperature 97.8 F (36.6 C), temperature source Oral, resp. rate 10, height 5' 8 (1.727 m), weight 69 kg, SpO2 100%.  Constitutional: well-appearing male sitting in bed, in no acute distress HENT: normocephalic atraumatic, mucous membranes moist Eyes: conjunctiva non-erythematous Neck: supple Cardiovascular: regular rate and rhythm, no m/r/g Pulmonary/Chest: normal work of breathing on room air, lungs clear to auscultation bilaterally Abdominal: Normoactive bowel sounds, mild tenderness in right lower quadrant MSK: reduced bulk most visible of upper legs (quads) and nml tone Neurological: alert & oriented x 3, 5/5 strength in bilateral upper and lower extremities, normal gait Skin: warm and dry Psych: normal mood and affect  Labs: CBC    Component Value Date/Time   WBC 5.2 04/29/2024 1029   RBC 4.57 04/29/2024 1029   HGB 14.3 04/29/2024 1039   HCT 42.0 04/29/2024 1039   PLT 184 04/29/2024 1029   MCV 90.8 04/29/2024 1029   MCH 30.6 04/29/2024 1029   MCHC  33.7 04/29/2024 1029   RDW 12.3 04/29/2024 1029   LYMPHSABS 2.4 08/22/2023 1309   MONOABS 0.7 08/22/2023 1309   EOSABS 79 09/27/2023 1523   BASOSABS 50 09/27/2023 1523     CMP     Component Value Date/Time   NA 136 04/29/2024 1039   K 4.5 04/29/2024 1039   CL 97 (L) 04/29/2024 1039   CO2 24 04/29/2024 1029   GLUCOSE 213 (H) 04/29/2024 1039   BUN 8 04/29/2024 1039   CREATININE 0.80 04/29/2024 1039   CREATININE 0.94 09/27/2023 1523   CALCIUM  7.7 (L) 04/29/2024 1029   PROT 4.5 (L) 04/29/2024 1029   ALBUMIN 2.7 (L) 04/29/2024 1029   AST 63 (H) 04/29/2024 1029   ALT 146 (H) 04/29/2024 1029   ALKPHOS 102 04/29/2024  1029   BILITOT 1.3 (H) 04/29/2024 1029   GFRNONAA >60 04/29/2024 1029   GFRAA >60 02/16/2020 1638    Imaging:  CT Abdomen Pelvis:  1. Prominent stool distending the rectum with diffuse low-density rectal wall thickening, compatible with stercoral proctitis. 2. Moderate stool elsewhere in the colon without wall thickening. 3. Stable changes of chronic pancreatitis. 4. Stable 3 cm pancreatic tail cyst. This remains most compatible with pseudocyst. This could be followed with a follow-up CT of the abdomen with contrast in 1 year.  EKG: personally reviewed my interpretation is sinus rate and rhythm, multiple PVCs  ASSESSMENT & PLAN:   Assessment & Plan by Problem: Principal Problem:   Stercoral colitis   Karl Torres is a 48 y.o. person living with a history of Type 2 diabetes mellitus, HTN who presented with abdominal pain and admitted for stercoral proctitis on hospital day 0  #Stercoral Proctitis  Etiology unclear, could be in the setting of uncontrolled diabetes with last A1c greater than 15.  Reassuring that he is not having any nausea or vomiting, and is still able to tolerate p.o. intake.  Will treat with laxative therapy, MiraLAX , senna, docusate. Lipase within normal limits and rest of labs reassuring.   # Type 2 diabetes At home takes Novolin 70/30 at 10 units twice a day, also metformin  1000 mg twice a day.  Good male appropriate care provider currently, however there is a recent [facility] from [facility] in February.  I think he would benefit from starting on long-acting insulin  in the outpatient setting.  In the meanwhile, we will start on sliding scale.  #Hx of Alcohol Use Disorder #Chronic Pancreatitis Continuing his thiamine  and folate supplements, has chronic pancreatitis seen on imaging thought to be in the setting of chronic alcohol use disorder.  He did appear slightly intoxicated my discussion with him, however ethanol level within normal limits.   Regardless, will keep on CIWA protocol without Ativan . Could be the reason for his elevated liver enzymes as well. RUQ ultrasound without abnormality.   #Pancreatic Pseudocyst Found on Imaging Stable 3 cm pancreatic tail cyst. This remains most compatible with pseudocyst. This could be followed with a follow-up CT of the abdomen with contrast in 1 year.    Diet: Normal VTE: Enoxaparin  IVF: None,None Code: Full  Prior to Admission Living Arrangement: Home Anticipated Discharge Location: Home Barriers to Discharge: Medical Management  Dispo: Admit patient to Observation with expected length of stay less than 2 midnights.  Signed: Jaqueline Uber, MD Internal Medicine Resident PGY-3  04/29/2024, 6:47 PM

## 2024-04-29 NOTE — Progress Notes (Addendum)
 Patient arrived to the unit in wheelchair, ambulates and transfers independently. Food and drinks provided. Home meds counted and form completed. No noted concerns.

## 2024-04-29 NOTE — Progress Notes (Signed)
 Home medication sent to the pharmacy.

## 2024-04-29 NOTE — ED Notes (Signed)
 Patient transported to [place]

## 2024-04-29 NOTE — ED Provider Notes (Signed)
 [place] [facility] AT [facility] Provider Note   CSN: [id number] Arrival date & time: 04/29/24  1017     Patient presents with: Dizziness   Karl Torres is a 48 y.o. male with PMHx DM, HTN who presents to ED concerned for dizziness and RUQ abdominal pain since waking up this morning around 4AM. Patient is a poor historian and his story continues to change by the minute, but states that his abdominal pain comes in flares over the past year and is from his pancreas. Patient also mentions losing a lot of weight over the past year. Patient also mentioning drinking and cannabis use and linking this to his pancreas problems while pointing at his RUQ. Patient endorsing diarrhea to the RN but then telling me that he has been suffering with constipation recently and was straining to have a BM earlier today. Patient also complaining of his chronic BL shoulder pain and intermittent/chronic BL thigh pains. Patient denies fever, chest pain, vomiting today.  Of note, EMS noting hypotension which resolved with 500ml IV fluid bolus.     Dizziness      Prior to Admission medications   Medication Sig Start Date End Date Taking? Authorizing Provider  aspirin  EC 81 MG tablet Take 1 tablet (81 mg total) by mouth daily. Swallow whole. Patient not taking: Reported on 03/16/2024 09/17/23   [name], [name], NP  atorvastatin  (LIPITOR) 10 MG tablet Take 1 tablet (10 mg total) by mouth daily. Patient not taking: Reported on 03/16/2024 09/17/23   [name], [name], NP  Blood Glucose Monitoring Suppl DEVI 1 each by Does not apply route in the morning, at noon, and at bedtime. May substitute to any manufacturer covered by patient's insurance. 01/31/24   Bernard Drivers, MD  fluticasone  (FLONASE ) 50 MCG/ACT nasal spray Place 2 sprays into both nostrils daily. Patient not taking: Reported on 03/16/2024 09/17/23   [name], [name], NP  folic acid  (FOLVITE ) 1 MG tablet Take 1 tablet (1 mg total) by mouth  daily. 03/18/24   Gonfa, Taye T, MD  Glucose Blood (BLOOD GLUCOSE TEST STRIPS) STRP 1 each by In Vitro route in the morning, at noon, and at bedtime. May substitute to any manufacturer covered by patient's insurance. Patient not taking: Reported on 03/16/2024 09/17/23   [name], [name], NP  insulin  isophane & regular human KwikPen (HUMULIN 70/30 MIX) (70-30) 100 UNIT/ML KwikPen Inject 10 Units into the skin 2 (two) times daily after breakfast and dinner 03/18/24   Kathrin Mignon DASEN, MD  Insulin  Pen Needle 32G X 4 MM MISC Use 1 needle in the morning and at bedtime with insulin  pen. 03/18/24   Gonfa, Taye T, MD  metFORMIN  (GLUCOPHAGE ) 1000 MG tablet Take 1 tablet (1,000 mg total) by mouth 2 (two) times daily with a meal. 03/18/24   Kathrin Mignon DASEN, MD  Multiple Vitamin (MULTIVITAMIN WITH MINERALS) TABS tablet Take 1 tablet by mouth daily. 03/18/24   Gonfa, Taye T, MD  nicotine  (NICODERM CQ  - DOSED IN MG/24 HOURS) 14 mg/24hr patch Place 1 patch (14 mg total) onto the skin daily. 03/18/24   Gonfa, Taye T, MD  thiamine  (VITAMIN B1) 100 MG tablet Take 1 tablet (100 mg total) by mouth daily. 03/18/24   Gonfa, Taye T, MD    Allergies: Patient has no known allergies.    Review of Systems  Neurological:  Positive for dizziness.    Updated Vital Signs BP 96/66   Pulse 78   Temp 97.8 F (  36.6 C) (Oral)   Resp 18   Ht 5' 8 (1.727 m)   Wt 69 kg   SpO2 100%   BMI 23.13 kg/m   Physical Exam Vitals and nursing note reviewed.  Constitutional:      General: He is not in acute distress.    Appearance: He is not ill-appearing or toxic-appearing.  HENT:     Head: Normocephalic and atraumatic.     Mouth/Throat:     Mouth: Mucous membranes are moist.     Pharynx: No oropharyngeal exudate or posterior oropharyngeal erythema.  Eyes:     General: No scleral icterus.       Right eye: No discharge.        Left eye: No discharge.     Conjunctiva/sclera: Conjunctivae normal.  Cardiovascular:     Rate and Rhythm:  Normal rate and regular rhythm.     Pulses: Normal pulses.     Heart sounds: Normal heart sounds. No murmur heard. Pulmonary:     Effort: Pulmonary effort is normal. No respiratory distress.     Breath sounds: Normal breath sounds. No wheezing, rhonchi or rales.  Abdominal:     General: Abdomen is flat. Bowel sounds are normal. There is no distension.     Palpations: Abdomen is soft. There is no mass.     Tenderness: There is abdominal tenderness.     Comments: RUQ tenderness to palpation  Musculoskeletal:     Right lower leg: No edema.     Left lower leg: No edema.  Skin:    General: Skin is warm and dry.     Findings: No rash.  Neurological:     General: No focal deficit present.     Mental Status: He is alert. Mental status is at baseline.  Psychiatric:        Mood and Affect: Mood normal.     (all labs ordered are listed, but only abnormal results are displayed) Labs Reviewed  COMPREHENSIVE METABOLIC PANEL WITH GFR - Abnormal; Notable for the following components:      Result Value   Glucose, Bld 214 (*)    Calcium  7.7 (*)    Total Protein 4.5 (*)    Albumin 2.7 (*)    AST 63 (*)    ALT 146 (*)    Total Bilirubin 1.3 (*)    All other components within normal limits  URINALYSIS, ROUTINE W REFLEX MICROSCOPIC - Abnormal; Notable for the following components:   Color, Urine AMBER (*)    APPearance HAZY (*)    Glucose, UA 150 (*)    Ketones, ur 5 (*)    Protein, ur 30 (*)    Bacteria, UA RARE (*)    All other components within normal limits  CBG MONITORING, ED - Abnormal; Notable for the following components:   Glucose-Capillary 195 (*)    All other components within normal limits  I-STAT CHEM 8, ED - Abnormal; Notable for the following components:   Chloride 97 (*)    Glucose, Bld 213 (*)    Calcium , Ion 1.03 (*)    All other components within normal limits  LIPASE, BLOOD  CBC  ETHANOL    EKG: None  Radiology: US  Abdomen Limited RUQ (LIVER/GB) Result  Date: 04/29/2024 CLINICAL DATA:  Right upper quadrant pain EXAM: ULTRASOUND ABDOMEN LIMITED RIGHT UPPER QUADRANT COMPARISON:  Same day CT abdomen and pelvis FINDINGS: Gallbladder: No gallstones or wall thickening visualized. No sonographic Murphy sign noted by sonographer. Common bile  duct: Diameter: 4 mm Liver: No focal lesion identified. Within normal limits in parenchymal echogenicity. Portal vein is patent on color Doppler imaging with normal direction of blood flow towards the liver. Other: None. IMPRESSION: Normal right upper quadrant ultrasound examination. Electronically Signed   By: Limin  Xu M.D.   On: 04/29/2024 15:39   CT ABDOMEN PELVIS W CONTRAST Result Date: 04/29/2024 CLINICAL DATA:  Acute, non localized abdominal pain. EXAM: CT ABDOMEN AND PELVIS WITH CONTRAST TECHNIQUE: Multidetector CT imaging of the abdomen and pelvis was performed using the standard protocol following bolus administration of intravenous contrast. RADIATION DOSE REDUCTION: This exam was performed according to the departmental dose-optimization program which includes automated exposure control, adjustment of the mA and/or kV according to patient size and/or use of iterative reconstruction technique. CONTRAST:  75mL OMNIPAQUE  IOHEXOL  350 MG/ML SOLN COMPARISON:  04/20/2023 chest, abdomen and pelvis CT dated 01/25/2022. FINDINGS: The examination is limited by streak artifacts produced by the patient's arms. Lower chest: 2 mm posterolateral right middle lobe nodule without significant change since 01/25/2022, not requiring imaging follow-up. Normal-sized heart. Hepatobiliary: No focal liver abnormality is seen. No gallstones, gallbladder wall thickening, or biliary dilatation. Pancreas: Diffusely atrophied with coarse calcifications, without significant change. The previously demonstrated 3.0 cm pancreatic tail cyst currently measures 3 cm in maximum diameter on image number 23/3. Spleen: Normal in size without focal abnormality.  Adrenals/Urinary Tract: Adrenal glands are unremarkable. Kidneys are normal, without renal calculi, focal lesion, or hydronephrosis. Bladder is unremarkable. Stomach/Bowel: Prominent stool distending the rectum with diffuse low-density rectal wall thickening. Moderate stool elsewhere in the colon without wall thickening. Unremarkable stomach and small bowel. The appendix is not identified. No secondary signs of appendicitis. Vascular/Lymphatic: Mild atheromatous arterial calcifications without aneurysm. No enlarged lymph nodes. Reproductive: Unremarkable prostate gland. Other: No abdominal wall hernia or abnormality. No abdominopelvic ascites. Musculoskeletal: Mild bilateral hip degenerative changes. Mild-to-moderate lumbar and lower thoracic spine degenerative changes. IMPRESSION: 1. Prominent stool distending the rectum with diffuse low-density rectal wall thickening, compatible with stercoral proctitis. 2. Moderate stool elsewhere in the colon without wall thickening. 3. Stable changes of chronic pancreatitis. 4. Stable 3 cm pancreatic tail cyst. This remains most compatible with pseudocyst. This could be followed with a follow-up CT of the abdomen with contrast in 1 year. Electronically Signed   By: Elspeth Bathe M.D.   On: 04/29/2024 13:09     .Critical Care  Performed by: Hoy Nidia FALCON, PA-C Authorized by: Hoy Nidia FALCON, PA-C   Critical care provider statement:    Critical care time (minutes):  30   Critical care time was exclusive of: stercoral colitis.   Critical care was time spent personally by me on the following activities:  Development of treatment plan with patient or surrogate, discussions with consultants, evaluation of patient's response to treatment, examination of patient, ordering and review of laboratory studies, ordering and review of radiographic studies, ordering and performing treatments and interventions, pulse oximetry, re-evaluation of patient's condition and  review of old charts    Medications Ordered in the ED  morphine  (PF) 4 MG/ML injection 4 mg (4 mg Intravenous Given 04/29/24 1250)  ondansetron  (ZOFRAN ) injection 4 mg (4 mg Intravenous Given 04/29/24 1250)  iohexol  (OMNIPAQUE ) 350 MG/ML injection 75 mL (75 mLs Intravenous Contrast Given 04/29/24 1249)  sodium chloride  0.9 % bolus 1,000 mL (0 mLs Intravenous Stopped 04/29/24 1412)  metroNIDAZOLE  (FLAGYL ) IVPB 500 mg (500 mg Intravenous New Bag/Given 04/29/24 1413)  cefTRIAXone  (ROCEPHIN ) 2 g in sodium chloride   0.9 % 100 mL IVPB (0 g Intravenous Stopped 04/29/24 1442)                                    Medical Decision Making Amount and/or Complexity of Data Reviewed Labs: ordered. Radiology: ordered.  Risk Prescription drug management.    This patient presents to the ED for concern of abdominal pain, this involves an extensive number of treatment options, and is a complaint that carries with it a high risk of complications and morbidity.  The differential diagnosis includes gastroenteritis, colitis, small bowel obstruction, appendicitis, cholecystitis, pancreatitis, nephrolithiasis, UTI, pyelonephritis   Co morbidities that complicate the patient evaluation  DM, HTN   Additional history obtained:  No PCP listed in chart.   Problem List / ED Course / Critical interventions / Medication management  Patient presented for multiple complaints. Physical exam with RUQ tenderness to palpation. Rest of physical exam reassuring. Patient afebrile with stable vitals.  I Ordered, and personally interpreted labs.  CBC without leukocytosis or anemia.  UA not concerning for infection.  CMP with elevation in AST/ALT at 63/146 today - probably due to recent EtOH use.  CBG 195.  Lipase within normal limits.  EtOH within normal limits. I ordered imaging studies including CT Abd/Pelvis and RUQ US .  I independently visualized and interpreted imaging and I agree with the radiologist interpretation.  CT scan  showing stercoral proctitis. There is also concern for prominent stool in rectum. DRE with Tech - no fecal impaction/hardened stool ball present. I personally viewed and interpreted the EKG/cardiac monitored which showed an underlying rhythm of: sinus rhythm Started patient on rocephin  and flagyl  for the stercoral proctitis. Will admit for further treatment. Patient agreeable to plan. I have reviewed the patients home medicines and have made adjustments as needed.    Social Determinants of Health:  none   3:51 PM Care of Tegan Burnside transferred to PA Harris at the end of my shift as the patient will require reassessment once labs/imaging have resulted. Patient presentation, ED course, and plan of care discussed with review of all pertinent labs and imaging. Please see his/her note for further details regarding further ED course and disposition. Plan at time of handoff is patient to be admitted for stercoral proctitis. This may be altered or completely changed at the discretion of the oncoming team pending results of further workup.      Final diagnoses:  Stercoral colitis    ED Discharge Orders     None          Hoy Nidia FALCON, [place] 04/29/24 1551    Patsey Lot, MD 05/03/24 [phone]

## 2024-04-29 NOTE — ED Triage Notes (Addendum)
 Pt [facility] from home for dizziness, feeling foggy beginning around 7am.  Found to be 98 SBP palp per EMS.  DIarrhea x 1 yr, 9/10 abd pain, with hx of pancreas and prostate issues. Also hx of DM (hyperglycemia).  500 ML of NS given.  112/78 HR 70 100% RA, CBG 259  18G L. AC

## 2024-04-29 NOTE — ED Provider Notes (Signed)
 Poor historian, abdominal pain. Light headed.  Scanned-was found to have stercoral colitis- needs  Physical Exam  BP 96/66   Pulse 78   Temp 97.8 F (36.6 C) (Oral)   Resp 18   Ht 5' 8 (1.727 m)   Wt 69 kg   SpO2 100%   BMI 23.13 kg/m   Physical Exam  Procedures  Procedures  ED Course / MDM    Medical Decision Making Amount and/or Complexity of Data Reviewed Labs: ordered. Radiology: ordered.  Risk Prescription drug management. Decision regarding hospitalization.   48 year old male found to have stercoral colitis will be admitted to the internal medicine service.       Arloa Chroman, PA-C 04/30/24 [id number]    Randol Simmonds, MD 04/30/24 [phone]

## 2024-04-29 NOTE — Hospital Course (Signed)
 Pancreas hurts? RUQ, hurts in his joints, vague symptoms. Trouble using bathroom, has been straining really hard.   Pain in belly, SOB, pain joints and legs, discombabulation: foggy, legs numb from buttocks to calves--feels its lack of blood flow; BM: orange, oily, straining a lot. Problems with urine stream, insulin  x2 in a week Joint pain: shoulders, elbows, knees, front and back of legs.  What bothers the most? eating

## 2024-04-30 ENCOUNTER — Encounter (HOSPITAL_COMMUNITY): Payer: Self-pay | Admitting: Internal Medicine

## 2024-04-30 DIAGNOSIS — K861 Other chronic pancreatitis: Secondary | ICD-10-CM

## 2024-04-30 DIAGNOSIS — K860 Alcohol-induced chronic pancreatitis: Secondary | ICD-10-CM | POA: Diagnosis present

## 2024-04-30 DIAGNOSIS — R531 Weakness: Secondary | ICD-10-CM

## 2024-04-30 DIAGNOSIS — K862 Cyst of pancreas: Secondary | ICD-10-CM | POA: Diagnosis present

## 2024-04-30 DIAGNOSIS — K5900 Constipation, unspecified: Secondary | ICD-10-CM | POA: Diagnosis present

## 2024-04-30 DIAGNOSIS — Z794 Long term (current) use of insulin: Secondary | ICD-10-CM

## 2024-04-30 DIAGNOSIS — K6289 Other specified diseases of anus and rectum: Secondary | ICD-10-CM

## 2024-04-30 DIAGNOSIS — E1142 Type 2 diabetes mellitus with diabetic polyneuropathy: Secondary | ICD-10-CM

## 2024-04-30 HISTORY — DX: Cyst of pancreas: K86.2

## 2024-04-30 LAB — CBC
HCT: 42.7 % (ref 39.0–52.0)
Hemoglobin: 14.4 g/dL (ref 13.0–17.0)
MCH: 30.8 pg (ref 26.0–34.0)
MCHC: 33.7 g/dL (ref 30.0–36.0)
MCV: 91.2 fL (ref 80.0–100.0)
Platelets: 186 K/uL (ref 150–400)
RBC: 4.68 MIL/uL (ref 4.22–5.81)
RDW: 12.6 % (ref 11.5–15.5)
WBC: 6.3 K/uL (ref 4.0–10.5)
nRBC: 0 % (ref 0.0–0.2)

## 2024-04-30 LAB — BASIC METABOLIC PANEL WITH GFR
Anion gap: 6 (ref 5–15)
BUN: 8 mg/dL (ref 6–20)
CO2: 29 mmol/L (ref 22–32)
Calcium: 8 mg/dL — ABNORMAL LOW (ref 8.9–10.3)
Chloride: 98 mmol/L (ref 98–111)
Creatinine, Ser: 0.68 mg/dL (ref 0.61–1.24)
GFR, Estimated: >60 mL/min (ref >60)
Glucose, Bld: 318 mg/dL — ABNORMAL HIGH (ref 70–99)
Potassium: 4.1 mmol/L (ref 3.5–5.1)
Sodium: 133 mmol/L — ABNORMAL LOW (ref 135–145)

## 2024-04-30 LAB — GLUCOSE, CAPILLARY
Glucose-Capillary: >600 mg/dL (ref 70–99)
Glucose-Capillary: >600 mg/dL (ref 70–99)
Glucose-Capillary: 326 mg/dL — ABNORMAL HIGH (ref 70–99)
Glucose-Capillary: 329 mg/dL — ABNORMAL HIGH (ref 70–99)
Glucose-Capillary: 142 mg/dL — ABNORMAL HIGH (ref 70–99)
Glucose-Capillary: 358 mg/dL — ABNORMAL HIGH (ref 70–99)

## 2024-04-30 MED ORDER — INSULIN ASPART 100 UNIT/ML IJ SOLN
5.0000 [IU] | Freq: Once | INTRAMUSCULAR | Status: AC
  Administered 2024-04-30: 5 [IU] via SUBCUTANEOUS

## 2024-04-30 MED ORDER — GABAPENTIN 300 MG PO CAPS
300.0000 mg | ORAL_CAPSULE | Freq: Every day | ORAL | Status: DC
  Administered 2024-04-30: 300 mg via ORAL
  Filled 2024-04-30: qty 1

## 2024-04-30 MED ORDER — SENNA 8.6 MG PO TABS
1.0000 | ORAL_TABLET | Freq: Two times a day (BID) | ORAL | Status: DC
  Administered 2024-04-30 – 2024-05-01 (×3): 8.6 mg via ORAL
  Filled 2024-04-30 (×3): qty 1

## 2024-04-30 MED ORDER — INSULIN GLARGINE 100 UNIT/ML ~~LOC~~ SOLN
10.0000 [IU] | Freq: Every day | SUBCUTANEOUS | Status: DC
  Administered 2024-04-30: 10 [IU] via SUBCUTANEOUS
  Filled 2024-04-30: qty 0.1

## 2024-04-30 MED ORDER — INSULIN GLARGINE 100 UNIT/ML ~~LOC~~ SOLN
13.0000 [IU] | Freq: Every day | SUBCUTANEOUS | Status: DC
  Administered 2024-05-01: 13 [IU] via SUBCUTANEOUS
  Filled 2024-04-30: qty 0.13

## 2024-04-30 MED ORDER — INSULIN ASPART 100 UNIT/ML IJ SOLN
0.0000 [IU] | Freq: Every day | INTRAMUSCULAR | Status: DC
  Administered 2024-04-30: 5 [IU] via SUBCUTANEOUS

## 2024-04-30 MED ORDER — INSULIN ASPART 100 UNIT/ML IJ SOLN
10.0000 [IU] | Freq: Once | INTRAMUSCULAR | Status: AC
  Administered 2024-04-30: 10 [IU] via SUBCUTANEOUS

## 2024-04-30 MED ORDER — SMOG ENEMA
960.0000 mL | Freq: Once | RECTAL | Status: AC
  Administered 2024-04-30: 960 mL via RECTAL
  Filled 2024-04-30: qty 960

## 2024-04-30 MED ORDER — POLYETHYLENE GLYCOL 3350 17 G PO PACK
17.0000 g | PACK | Freq: Two times a day (BID) | ORAL | Status: DC
  Administered 2024-04-30 – 2024-05-01 (×2): 17 g via ORAL
  Filled 2024-04-30 (×3): qty 1

## 2024-04-30 MED ORDER — INSULIN ASPART 100 UNIT/ML IJ SOLN
8.0000 [IU] | Freq: Three times a day (TID) | INTRAMUSCULAR | Status: DC
  Administered 2024-04-30 – 2024-05-01 (×2): 8 [IU] via SUBCUTANEOUS

## 2024-04-30 NOTE — Progress Notes (Signed)
 Subjective:   Summary: Karl Torres is a 48 y.o. year old male currently admitted on the IMTS HD#0 for stercoral proctitis.  Overnight Events: NA  Pt seen bedside this AM. Still having some abdominal pain, reports he has had small bowel movements.   Objective:  Vital signs in last 24 hours: Vitals:   04/30/24 0249 04/30/24 0445 04/30/24 0549 04/30/24 0656  BP: 98/70  102/74 119/85  Pulse: 79 61 (!) 52 64  Resp: 18  16 17   Temp: 97.8 F (36.6 C)  97.7 F (36.5 C) 98.7 F (37.1 C)  TempSrc: Oral  Oral Oral  SpO2: (!) 70% 99% 100% 100%  Weight:      Height:       Supplemental O2: Room Air SpO2: 100 %   Physical Exam:  Constitutional: well-appearing male laying in bed, in no acute distress Cardiovascular: RRR, no murmurs, rubs or gallops Pulmonary/Chest: normal work of breathing on room air, lungs clear to auscultation bilaterally Abdominal: soft, non-tender, non-distended Skin: warm and dry. Subcutaneous nodules located on the anteromedial aspect of right lower extremity Extremities: upper/lower extremity pulses 2+, no lower extremity edema present Neurologic: Decreased sensation in lower extremities, specifically in toes. Normal upper and lower extremity strength, Coordination in tact, able to ambulate without issue.   Filed Weights   04/29/24 1048  Weight: 69 kg     Intake/Output Summary (Last 24 hours) at 04/30/2024 1311 Last data filed at 04/30/2024 0300 Gross per 24 hour  Intake 2300 ml  Output --  Net 2300 ml   Net IO Since Admission: 2,300 mL [04/30/24 1311]  Pertinent Labs:    Latest Ref Rng & Units 04/30/2024    3:29 AM 04/29/2024   10:39 AM 04/29/2024   10:29 AM  CBC  WBC 4.0 - 10.5 K/uL 6.3   5.2   Hemoglobin 13.0 - 17.0 g/dL 85.5  85.6  85.9   Hematocrit 39.0 - 52.0 % 42.7  42.0  41.5   Platelets 150 - 400 K/uL 186   184        Latest Ref Rng & Units 04/30/2024    3:29 AM 04/29/2024   10:39 AM 04/29/2024   10:29 AM  CMP  Glucose  70 - 99 mg/dL 681  786  785   BUN 6 - 20 mg/dL 8  8  8    Creatinine 0.61 - 1.24 mg/dL 9.31  9.19  9.29   Sodium 135 - 145 mmol/L 133  136  135   Potassium 3.5 - 5.1 mmol/L 4.1  4.5  4.5   Chloride 98 - 111 mmol/L 98  97  99   CO2 22 - 32 mmol/L 29   24   Calcium  8.9 - 10.3 mg/dL 8.0   7.7   Total Protein 6.5 - 8.1 g/dL   4.5   Total Bilirubin 0.0 - 1.2 mg/dL   1.3   Alkaline Phos 38 - 126 U/L   102   AST 15 - 41 U/L   63   ALT 0 - 44 U/L   146      Imaging: US  Abdomen Limited RUQ (LIVER/GB) Result Date: 04/29/2024 CLINICAL DATA:  Right upper quadrant pain EXAM: ULTRASOUND ABDOMEN LIMITED RIGHT UPPER QUADRANT COMPARISON:  Same day CT abdomen and pelvis FINDINGS: Gallbladder: No gallstones or wall thickening visualized. No sonographic Murphy sign noted by sonographer. Common bile duct: Diameter: 4 mm Liver:  No focal lesion identified. Within normal limits in parenchymal echogenicity. Portal vein is patent on color Doppler imaging with normal direction of blood flow towards the liver. Other: None. IMPRESSION: Normal right upper quadrant ultrasound examination. Electronically Signed   By: Limin  Xu M.D.   On: 04/29/2024 15:39    Assessment/Plan:   Principal Problem:   Stercoral colitis   Patient Summary: Karl Torres is a 48 y.o. with a pertinent PMH of uncontrolled type 2 diabetes, HTN, who presented with abdominal pain and admitted for stercoral proctitis.    #Stercoral Proctitis Has had multiple small bowel movements, but still is feeling the abdominal pain. Current bowel regimen is miralax  BID, senna BID, and docusate. Will trial SMOG enema today as he does have a very large stool collection in the rectal area.   #Uncontrolled Type 2 Diabetes #Diabetic Neuropathy Was only taking 70/30 10U BID at home. Suspect there may be some financial barriers in place as well given lack of insurance. Started him on long acting insulin  10U, and continuing his sliding scale. His glucose still  remains out of control, so will increase his lantus  to 13U. Suspect he may need meal time as well, so have added on 8U of short acting with meals, on top of sliding scale.   Plan:  - Increase long acting to 13U  - 8U Novolog  with meals  - Sliding scale    #Chronic Pancreatitis Has this diagnosis with history of multiple bouts of diarrhea per chart review. Has not been evaluated previously, will send for fecal elastase. Certainly could be contributing to his abdominal pain.   #Weakness Complaining of weakness in his lower extremities intermittently, as well as in his cervical area. Neuro exam within normal limits, with some decreased sensation in his lower extremities, specifically his toes. Likely some neuropathic pain in the setting of uncontrolled type 2 DM.  Will trial gabapentin .     Diet: Normal IVF: None,None VTE: Enoxaparin  Code: Full PT/OT recs: None, none. TOC recs: NA    Dispo: Anticipated discharge to Home in 2 days pending medical management.   Milanna Kozlov, MD PGY-3 Internal Medicine Resident Pager Number [phone] Please contact the on call pager after 5 pm and on weekends at [phone].

## 2024-04-30 NOTE — Plan of Care (Signed)
  Problem: Education: Goal: Ability to describe self-care measures that may prevent or decrease complications (Diabetes Survival Skills Education) will improve Outcome: Progressing   Problem: Coping: Goal: Ability to adjust to condition or change in health will improve Outcome: Progressing   Problem: Health Behavior/Discharge Planning: Goal: Ability to identify and utilize available resources and services will improve Outcome: Progressing

## 2024-04-30 NOTE — Plan of Care (Signed)
  Problem: Education: Goal: Ability to describe self-care measures that may prevent or decrease complications (Diabetes Survival Skills Education) will improve Outcome: Progressing   Problem: Fluid Volume: Goal: Ability to maintain a balanced intake and output will improve Outcome: Progressing   Problem: Health Behavior/Discharge Planning: Goal: Ability to identify and utilize available resources and services will improve Outcome: Progressing Goal: Ability to manage health-related needs will improve Outcome: Progressing   Problem: Health Behavior/Discharge Planning: Goal: Ability to manage health-related needs will improve Outcome: Progressing   Problem: Clinical Measurements: Goal: Ability to maintain clinical measurements within normal limits will improve Outcome: Progressing Goal: Will remain free from infection Outcome: Progressing Goal: Diagnostic test results will improve Outcome: Progressing

## 2024-05-01 ENCOUNTER — Other Ambulatory Visit (HOSPITAL_COMMUNITY): Payer: Self-pay

## 2024-05-01 DIAGNOSIS — K5289 Other specified noninfective gastroenteritis and colitis: Principal | ICD-10-CM

## 2024-05-01 DIAGNOSIS — I10 Essential (primary) hypertension: Secondary | ICD-10-CM

## 2024-05-01 DIAGNOSIS — K862 Cyst of pancreas: Secondary | ICD-10-CM

## 2024-05-01 DIAGNOSIS — Z7984 Long term (current) use of oral hypoglycemic drugs: Secondary | ICD-10-CM

## 2024-05-01 DIAGNOSIS — F1020 Alcohol dependence, uncomplicated: Secondary | ICD-10-CM

## 2024-05-01 LAB — GLUCOSE, CAPILLARY
Glucose-Capillary: 106 mg/dL — ABNORMAL HIGH (ref 70–99)
Glucose-Capillary: 435 mg/dL — ABNORMAL HIGH (ref 70–99)

## 2024-05-01 MED ORDER — ACETAMINOPHEN 500 MG PO TABS
1000.0000 mg | ORAL_TABLET | Freq: Four times a day (QID) | ORAL | 0 refills | Status: AC | PRN
  Filled 2024-05-01: qty 30, 4d supply, fill #0

## 2024-05-01 MED ORDER — GABAPENTIN 300 MG PO CAPS
300.0000 mg | ORAL_CAPSULE | Freq: Every day | ORAL | 0 refills | Status: DC
  Filled 2024-05-01: qty 30, 30d supply, fill #0

## 2024-05-01 MED ORDER — INSULIN ISOPHANE & REGULAR (HUMAN 70-30)100 UNIT/ML KWIKPEN
15.0000 [IU] | PEN_INJECTOR | Freq: Two times a day (BID) | SUBCUTANEOUS | 0 refills | Status: DC
Start: 2024-05-01 — End: 2024-07-18
  Filled 2024-05-01: qty 9, 30d supply, fill #0

## 2024-05-01 MED ORDER — DICLOFENAC SODIUM 1 % EX GEL
2.0000 g | Freq: Four times a day (QID) | CUTANEOUS | Status: DC
  Administered 2024-05-01 (×2): 2 g via TOPICAL
  Filled 2024-05-01: qty 100

## 2024-05-01 MED ORDER — SENNA 8.6 MG PO TABS
1.0000 | ORAL_TABLET | Freq: Two times a day (BID) | ORAL | 0 refills | Status: DC
  Filled 2024-05-01: qty 120, 60d supply, fill #0

## 2024-05-01 MED ORDER — DOCUSATE SODIUM 100 MG PO CAPS
100.0000 mg | ORAL_CAPSULE | Freq: Every day | ORAL | 0 refills | Status: DC
  Filled 2024-05-01: qty 10, 10d supply, fill #0

## 2024-05-01 MED ORDER — POLYETHYLENE GLYCOL 3350 17 GM/SCOOP PO POWD
17.0000 g | Freq: Two times a day (BID) | ORAL | 0 refills | Status: DC
Start: 2024-05-01 — End: 2024-07-18
  Filled 2024-05-01: qty 238, 7d supply, fill #0

## 2024-05-01 MED ORDER — KETOROLAC TROMETHAMINE 15 MG/ML IJ SOLN
15.0000 mg | Freq: Once | INTRAMUSCULAR | Status: AC
  Administered 2024-05-01: 15 mg via INTRAVENOUS
  Filled 2024-05-01: qty 1

## 2024-05-01 NOTE — TOC Progression Note (Signed)
 [facility]) - Progression Note    Patient Details  Name: Karl Torres MRN: [id number] Date of Birth: 10/05/1975  Transition of [facility]) CM/SW Contact  Roiza Wiedel LITTIE [place], [place] Phone Number: 05/01/2024, 11:26 AM  Clinical Narrative:    CSW completed SDOH consult with pt and offered pt community resource packet, pt accepted.                      Expected Discharge Plan and Services         Expected Discharge Date: 05/01/24                                     Social Drivers of Health (SDOH) Interventions SDOH Screenings   Food Insecurity: Food Insecurity Present (04/29/2024)  Housing: High Risk (04/29/2024)  Transportation Needs: Unmet Transportation Needs (04/29/2024)  Utilities: At Risk (04/29/2024)  Depression (PHQ2-9): Low Risk  (09/27/2023)  Tobacco Use: High Risk (04/29/2024)    Readmission Risk Interventions     No data to display

## 2024-05-01 NOTE — Progress Notes (Signed)
 Picked up Home Meds and delivered to patient. Reviewed AVS, patient expressed understanding of medications, MD follow up reviewed.    Patient states all belongings brought to the hospital at time of admission are accounted for and packed to take home.   Patient informed and expressed understanding where to pick up discharge medications.   Patient request to finish lunch before being transported to D/C lounge and will transport home via bus pass.

## 2024-05-01 NOTE — Plan of Care (Signed)
  Problem: Coping: Goal: Ability to adjust to condition or change in health will improve Outcome: Progressing   Problem: Activity: Goal: Risk for activity intolerance will decrease Outcome: Progressing   Problem: Coping: Goal: Level of anxiety will decrease Outcome: Progressing   Problem: Elimination: Goal: Will not experience complications related to bowel motility Outcome: Progressing

## 2024-05-01 NOTE — Discharge Summary (Signed)
 Name: [name] MRN: [id number] DOB: 10/20/1975 48 y.o. PCP: Pcp, No  Date of Admission: 04/29/2024 10:17 AM Date of Discharge: 05/01/2024  Attending Physician: Dr. Dayton Eastern  Discharge Diagnosis: Principal Problem:   Stercoral proctitis Active Problems:   Uncontrolled type 2 diabetes mellitus with hyperglycemia, with long-term current use of insulin  (HCC)   Obstipation   Diabetic peripheral neuropathy associated with type 2 diabetes mellitus (HCC)   Alcohol-induced chronic pancreatitis (HCC)   Pancreatic cyst Pseudohyponatremia  Discharge Medications: Allergies as of 05/01/2024   No Known Allergies      Medication List     TAKE these medications    acetaminophen  500 MG tablet Commonly known as: TYLENOL  Take 2 tablets (1,000 mg total) by mouth every 6 (six) hours as needed for headache, moderate pain (pain score 4-6) or mild pain (pain score 1-3).   aspirin  EC 81 MG tablet Take 1 tablet (81 mg total) by mouth daily. Swallow whole.   atorvastatin  10 MG tablet Commonly known as: LIPITOR Take 1 tablet (10 mg total) by mouth daily.   Blood Glucose Monitoring Suppl Devi 1 each by Does not apply route in the morning, at noon, and at bedtime. May substitute to any manufacturer covered by patient's insurance.   BLOOD GLUCOSE TEST STRIPS Strp 1 each by In Vitro route in the morning, at noon, and at bedtime. May substitute to any manufacturer covered by patient's insurance.   docusate sodium  100 MG capsule Commonly known as: COLACE Take 1 capsule (100 mg total) by mouth daily.   fluticasone  50 MCG/ACT nasal spray Commonly known as: FLONASE  Place 2 sprays into both nostrils daily.   folic acid  1 MG tablet Commonly known as: FOLVITE  Take 1 tablet (1 mg total) by mouth daily.   gabapentin  300 MG capsule Commonly known as: NEURONTIN  Take 1 capsule (300 mg total) by mouth at bedtime.   insulin  isophane & regular human KwikPen (70-30) 100 UNIT/ML KwikPen Commonly  known as: HUMULIN 70/30 MIX Inject 15 Units into the skin 2 (two) times daily before lunch and supper. What changed:  how much to take when to take this   metFORMIN  1000 MG tablet Commonly known as: GLUCOPHAGE  Take 1 tablet (1,000 mg total) by mouth 2 (two) times daily with a meal.   multivitamin with minerals Tabs tablet Take 1 tablet by mouth daily.   nicotine  14 mg/24hr patch Commonly known as: NICODERM CQ  - dosed in mg/24 hours Place 1 patch (14 mg total) onto the skin daily.   polyethylene glycol powder 17 GM/SCOOP powder Commonly known as: GLYCOLAX /MIRALAX  Take 1 capful (17 g) with water by mouth 2 (two) times daily. Dissolve 1 capful (17g) in 4-8 ounces of liquid and take by mouth daily.   senna 8.6 MG Tabs tablet Commonly known as: SENOKOT Take 1 tablet (8.6 mg total) by mouth 2 (two) times daily.   TechLite Pen Needles 32G X 4 MM Misc Generic drug: Insulin  Pen Needle Use 1 needle in the morning and at bedtime with insulin  pen.   thiamine  100 MG tablet Commonly known as: VITAMIN B1 Take 1 tablet (100 mg total) by mouth daily.        Disposition and follow-up:   Mr.Karl Torres was discharged from [facility] in Good condition.  At the hospital follow up visit please address:  1.  Follow-up:  a. Stercoral Proctitis - imaging findings consistent with stercoral proctitis.  Patient was given bowel regimen and proceeded to have several bowel  movements.  Please ensure that he is still having good output.    b. T2DM - consider adding a long-acting insulin  to his regimen.  Glucoses were extremely elevated and required sliding scale, Lantus , and NovoLog  with meals. We have increased his insulin  to 15u twice daily.   c. Chronic Pancreatitis - elastase pending which may indicate pancreatic insufficiency in the right setting.   d. Pancreatic Pseudocyst - Needs follow up CT abd/pelv with contrast in 1 year.  2.  Labs / imaging needed at time of follow-up:  None  3.  Pending labs/ test needing follow-up: fecal elastase   Follow-up Appointments:  Follow-up Information     Paseda, Folashade R, FNP Follow up.   Specialty: Nurse Practitioner Why: TIME :  1:00 PM   PLEASE ARRIVE AT 12:30 PM DATE : NOVEMBER  05 , 2025  PLEASE BRING ALL CURRENT MEDICATION and ID . [employer] Contact information: [address] [place], [place] [address] [phone]                 Hospital Course by problem list: #Stercoral Proctitis  Patient presented with sharp RUQ pain upon presentation.  Found to have stercoral proctitis on CT imaging.  Etiology is unclear but thought to be in the setting of dehydration due to poor PO intake and uncontrolled diabetes. On exam, the patient was soft, non-tender, and non-distended when distracted. He was still able to tolerate p.o. intake during this admission.  He was treated with laxative therapy including MiraLAX , senna, docusate, and SMOG enema which helped him have several bowel movements. Lipase was found to be within normal limits rest of labs reassuring.   #Type 2 Diabetes with Peripheral Neuropathy At home takes Novolin 70/30 at 10 units twice a day, also metformin  1000 mg twice a day.  During this admission, his glucose was severely elevated so Lantus  was increased to 13 units and 8 units of NovoLog  was given with meals in addition to sliding scale.  I think there may be some financial barriers preventing him from achieving control of his glucoses. The patient would likely benefit from starting on long-acting insulin  in the outpatient setting. In the meantime, we discharged him with an increased regimen to 15 units twice daily. He was also found to have numbness/tingling in his lower extremities and we started him on Gabapentin .   #Hx of Alcohol Use Disorder #Chronic Pancreatitis During this admission, we continued his thiamine  and folate supplements.  CT imaging consistent with stable chronic  pancreatitis.  This was likely due to chronic alcohol use disorder.  Despite a normal ethanol level, we continued CIWA protocol without Ativan .  Liver enzymes were elevated but RUQ ultrasound without abnormality. Lipase wnl but ordered elastase to evaluate for exocrine pancreatic function and potential need for enzyme replacement.  #Pancreatic Pseudocyst Found on Imaging Stable 3 cm pancreatic tail cyst was found on CT imaging likely representing pseudocyst.  He should receive follow-up in 1 year with a CT abdomen with contrast.  #Hypertension Patient has a reported history of hypertension not currently on medications.  Blood pressures have been low-normal during this admission and he did not require any antihypertensive therapy.   Discharge Subjective: Mr.Karl Torres reports 4-5 BM after receiving his enema. Not in pain now but had pain in shoulders before; BM with oatmeal-like appearance and orange oily color. No blood. States that he will be getting food stamps starting 10/3. Most of the time he sleeps under a tree. Recently lost job.  Previously worked for an [employer], corporate.  Patient is medically ready for discharge.  Discharge Exam:   BP 115/72 (BP Location: Left Arm)   Pulse (!) 56   Temp 98 F (36.7 C) (Oral)   Resp 18   Ht 5' 8 (1.727 m)   Wt 64.6 kg   SpO2 100%   BMI 21.65 kg/m  Physical Exam: Constitutional: well-appearing, well-nourished, in no acute distress HENT: normocephalic atraumatic, mucous membranes moist Eyes: conjunctiva non-erythematous, PERRL, no scleral icterus Cardiovascular: regular rate and rhythm, no m/r/g Pulmonary/Chest: normal work of breathing on room air, anterior lung fields clear to auscultation bilaterally Abdominal: soft, non-tender, non-distended, bowel sounds normal MSK: normal bulk and tone Neurological: alert & oriented x3, sitting up in bed, moving all extremities equally Skin: warm and dry Extremities: no edema or cyanosis;  peripheral pulses intact Psych: normal mood and affect, thought content normal   Pertinent Labs, Studies, and Procedures:     Latest Ref Rng & Units 04/30/2024    3:29 AM 04/29/2024   10:39 AM 04/29/2024   10:29 AM  CBC  WBC 4.0 - 10.5 K/uL 6.3   5.2   Hemoglobin 13.0 - 17.0 g/dL 85.5  85.6  85.9   Hematocrit 39.0 - 52.0 % 42.7  42.0  41.5   Platelets 150 - 400 K/uL 186   184        Latest Ref Rng & Units 04/30/2024    3:29 AM 04/29/2024   10:39 AM 04/29/2024   10:29 AM  CMP  Glucose 70 - 99 mg/dL 681  786  785   BUN 6 - 20 mg/dL 8  8  8    Creatinine 0.61 - 1.24 mg/dL 9.31  9.19  9.29   Sodium 135 - 145 mmol/L 133  136  135   Potassium 3.5 - 5.1 mmol/L 4.1  4.5  4.5   Chloride 98 - 111 mmol/L 98  97  99   CO2 22 - 32 mmol/L 29   24   Calcium  8.9 - 10.3 mg/dL 8.0   7.7   Total Protein 6.5 - 8.1 g/dL   4.5   Total Bilirubin 0.0 - 1.2 mg/dL   1.3   Alkaline Phos 38 - 126 U/L   102   AST 15 - 41 U/L   63   ALT 0 - 44 U/L   146     US  Abdomen Limited RUQ (LIVER/GB) Result Date: 04/29/2024 CLINICAL DATA:  Right upper quadrant pain EXAM: ULTRASOUND ABDOMEN LIMITED RIGHT UPPER QUADRANT COMPARISON:  Same day CT abdomen and pelvis FINDINGS: Gallbladder: No gallstones or wall thickening visualized. No sonographic Murphy sign noted by sonographer. Common bile duct: Diameter: 4 mm Liver: No focal lesion identified. Within normal limits in parenchymal echogenicity. Portal vein is patent on color Doppler imaging with normal direction of blood flow towards the liver. Other: None. IMPRESSION: Normal right upper quadrant ultrasound examination. Electronically Signed   By: Limin  Xu M.D.   On: 04/29/2024 15:39   CT ABDOMEN PELVIS W CONTRAST Result Date: 04/29/2024 CLINICAL DATA:  Acute, non localized abdominal pain. EXAM: CT ABDOMEN AND PELVIS WITH CONTRAST TECHNIQUE: Multidetector CT imaging of the abdomen and pelvis was performed using the standard protocol following bolus administration of intravenous  contrast. RADIATION DOSE REDUCTION: This exam was performed according to the departmental dose-optimization program which includes automated exposure control, adjustment of the mA and/or kV according to patient size and/or use of iterative reconstruction technique. CONTRAST:  75mL OMNIPAQUE   IOHEXOL  350 MG/ML SOLN COMPARISON:  04/20/2023 chest, abdomen and pelvis CT dated 01/25/2022. FINDINGS: The examination is limited by streak artifacts produced by the patient's arms. Lower chest: 2 mm posterolateral right middle lobe nodule without significant change since 01/25/2022, not requiring imaging follow-up. Normal-sized heart. Hepatobiliary: No focal liver abnormality is seen. No gallstones, gallbladder wall thickening, or biliary dilatation. Pancreas: Diffusely atrophied with coarse calcifications, without significant change. The previously demonstrated 3.0 cm pancreatic tail cyst currently measures 3 cm in maximum diameter on image number 23/3. Spleen: Normal in size without focal abnormality. Adrenals/Urinary Tract: Adrenal glands are unremarkable. Kidneys are normal, without renal calculi, focal lesion, or hydronephrosis. Bladder is unremarkable. Stomach/Bowel: Prominent stool distending the rectum with diffuse low-density rectal wall thickening. Moderate stool elsewhere in the colon without wall thickening. Unremarkable stomach and small bowel. The appendix is not identified. No secondary signs of appendicitis. Vascular/Lymphatic: Mild atheromatous arterial calcifications without aneurysm. No enlarged lymph nodes. Reproductive: Unremarkable prostate gland. Other: No abdominal wall hernia or abnormality. No abdominopelvic ascites. Musculoskeletal: Mild bilateral hip degenerative changes. Mild-to-moderate lumbar and lower thoracic spine degenerative changes. IMPRESSION: 1. Prominent stool distending the rectum with diffuse low-density rectal wall thickening, compatible with stercoral proctitis. 2. Moderate stool  elsewhere in the colon without wall thickening. 3. Stable changes of chronic pancreatitis. 4. Stable 3 cm pancreatic tail cyst. This remains most compatible with pseudocyst. This could be followed with a follow-up CT of the abdomen with contrast in 1 year. Electronically Signed   By: Elspeth Bathe M.D.   On: 04/29/2024 13:09     Discharge Instructions:   Discharge Instructions      To Leviticus Harton or their caretakers,  You were recently admitted to [facility] for stercoral proctitis which is a build up of stool in your rectum causing inflammation.  You will receive medications for constipation upon discharge.  We have also changed your insulin  to 15 units twice a day with lunch and dinner.  Continue taking your home medications with the following changes:  Start taking Insulin  isophane and regular human KwikPen (Humulin 70/30 mix) - inject 15 units into the skin 2 times daily before lunch and supper Acetaminophen  (Tylenol ) 500 mg - take 2 tablets as needed every 6 hours for headache or moderate pain Docusate sodium  (Colace) 100 mg daily Gabapentin  (Neurontin ) 300 mg at bedtime Polyethylene glycol (MiraLAX /GlycoLax ) 17 g packet 2 times daily Senna (Senokot) 8.6 mg 2 times daily Thiamine  (vitamin B1) 100 mg daily Continue taking Aspirin  81 mg daily Atorvastatin  (Lipitor) 10 mg daily Fluticasone  (Flonase ) 50 mcg/ACT nasal spray Folic acid  (Folvite ) 1 mg daily Metformin  (Glucophage ) 1000 mg twice daily Multivitamin Nicotine  patch Thiamine  (vitamin B1) 100 mg daily   You should seek further medical care if you experience worsening abdominal pain, nausea/vomiting, or fever. Please also seek medical care if you do not have a bowel movement for several weeks.   We recommend that you also see your primary care doctor in about a week to make sure that you continue to improve. We are so glad that you are feeling better.  Sincerely,  [facility]      Signed:  Letha Cheadle, MD Internal Medicine Resident, PGY-1 05/01/2024, 10:35 AM Please contact the on call pager after 5 pm and on weekends at [phone].

## 2024-05-01 NOTE — Discharge Instructions (Addendum)
 To Karl Torres or their caretakers,  You were recently admitted to [facility] for stercoral proctitis which is a build up of stool in your rectum causing inflammation.  You will receive medications for constipation upon discharge.  We have also changed your insulin  to 15 units twice a day with lunch and dinner.  Continue taking your home medications with the following changes:  Start taking Insulin  isophane and regular human KwikPen (Humulin 70/30 mix) - inject 15 units into the skin 2 times daily before lunch and supper Acetaminophen  (Tylenol ) 500 mg - take 2 tablets as needed every 6 hours for headache or moderate pain Docusate sodium  (Colace) 100 mg daily Gabapentin  (Neurontin ) 300 mg at bedtime Polyethylene glycol (MiraLAX /GlycoLax ) 17 g packet 2 times daily Senna (Senokot) 8.6 mg 2 times daily Thiamine  (vitamin B1) 100 mg daily Continue taking Aspirin  81 mg daily Atorvastatin  (Lipitor) 10 mg daily Fluticasone  (Flonase ) 50 mcg/ACT nasal spray Folic acid  (Folvite ) 1 mg daily Metformin  (Glucophage ) 1000 mg twice daily Multivitamin Nicotine  patch Thiamine  (vitamin B1) 100 mg daily   You should seek further medical care if you experience worsening abdominal pain, nausea/vomiting, or fever. Please also seek medical care if you do not have a bowel movement for several weeks.   We recommend that you also see your primary care doctor in about a week to make sure that you continue to improve. We are so glad that you are feeling better.  Sincerely,  [facility]

## 2024-05-02 LAB — PANCREATIC ELASTASE, FECAL: Pancreatic Elastase-1, Stool: <1 ug Elast./g — ABNORMAL LOW (ref >200)

## 2024-05-05 ENCOUNTER — Telehealth: Payer: Self-pay

## 2024-05-05 ENCOUNTER — Other Ambulatory Visit (HOSPITAL_COMMUNITY): Payer: Self-pay

## 2024-05-05 NOTE — Progress Notes (Signed)
 Patient recently hospitalized at [facility] from 04/29/24 to 05/01/24 for stercoral proctitis. Throughout admission he had difficult to control blood sugars, in the setting of chronic pancreatitis. His pancreatic elastase was < 1, indicating several pancreatitic insufficiency. I was contacted by ITMS, who discharged the patient, who is recommending that patient start Creon. However, they have not been successful in contacting him over the phone. Appears that he DOES have insurance ([employer]). PA is required for medication, so I am not able to complete a test claim at this time.   I attempted to outreach the patient today and LVM with my call back number. Would like to move up his HFU which is currently scheduled for 06/28/24 at [facility]. Will recommend that PCP refer to pharmacy for ongoing management of DM.   Lab Results  Component Value Date   HGBA1C >15.5 (H) 03/16/2024   HGBA1C >15.5 (H) 01/31/2024   HGBA1C >14.0 (H) 09/17/2023    Lorain Baseman, PharmD [facility] [phone]

## 2024-05-08 ENCOUNTER — Ambulatory Visit: Payer: Self-pay | Admitting: Internal Medicine

## 2024-05-18 ENCOUNTER — Other Ambulatory Visit: Payer: Self-pay

## 2024-05-18 ENCOUNTER — Encounter (HOSPITAL_COMMUNITY): Payer: Self-pay

## 2024-05-18 ENCOUNTER — Emergency Department (HOSPITAL_COMMUNITY)
Admission: EM | Admit: 2024-05-18 | Discharge: 2024-05-19 | Disposition: A | Discharge status: Home / Self Care | Payer: Self-pay | Attending: Emergency Medicine | Admitting: Emergency Medicine

## 2024-05-18 DIAGNOSIS — Z7984 Long term (current) use of oral hypoglycemic drugs: Secondary | ICD-10-CM | POA: Insufficient documentation

## 2024-05-18 DIAGNOSIS — I10 Essential (primary) hypertension: Secondary | ICD-10-CM | POA: Insufficient documentation

## 2024-05-18 DIAGNOSIS — R531 Weakness: Secondary | ICD-10-CM | POA: Insufficient documentation

## 2024-05-18 DIAGNOSIS — Z794 Long term (current) use of insulin: Secondary | ICD-10-CM | POA: Insufficient documentation

## 2024-05-18 DIAGNOSIS — R739 Hyperglycemia, unspecified: Secondary | ICD-10-CM

## 2024-05-18 DIAGNOSIS — Z79899 Other long term (current) drug therapy: Secondary | ICD-10-CM | POA: Insufficient documentation

## 2024-05-18 DIAGNOSIS — Z7982 Long term (current) use of aspirin: Secondary | ICD-10-CM | POA: Insufficient documentation

## 2024-05-18 DIAGNOSIS — R42 Dizziness and giddiness: Secondary | ICD-10-CM | POA: Insufficient documentation

## 2024-05-18 DIAGNOSIS — R519 Headache, unspecified: Secondary | ICD-10-CM | POA: Insufficient documentation

## 2024-05-18 DIAGNOSIS — R059 Cough, unspecified: Secondary | ICD-10-CM | POA: Insufficient documentation

## 2024-05-18 DIAGNOSIS — M791 Myalgia, unspecified site: Secondary | ICD-10-CM | POA: Insufficient documentation

## 2024-05-18 DIAGNOSIS — R6883 Chills (without fever): Secondary | ICD-10-CM | POA: Insufficient documentation

## 2024-05-18 DIAGNOSIS — R0602 Shortness of breath: Secondary | ICD-10-CM | POA: Insufficient documentation

## 2024-05-18 DIAGNOSIS — E1165 Type 2 diabetes mellitus with hyperglycemia: Secondary | ICD-10-CM | POA: Insufficient documentation

## 2024-05-18 LAB — RESP PANEL BY RT-PCR (RSV, FLU A&B, COVID)  RVPGX2
Influenza A by PCR: NEGATIVE
Influenza B by PCR: NEGATIVE
Resp Syncytial Virus by PCR: NEGATIVE
SARS Coronavirus 2 by RT PCR: NEGATIVE

## 2024-05-18 LAB — CBC WITH DIFFERENTIAL/PLATELET
Abs Immature Granulocytes: 0.03 K/uL (ref 0.00–0.07)
Basophils Absolute: 0 K/uL (ref 0.0–0.1)
Basophils Relative: 1 %
Eosinophils Absolute: 0.1 K/uL (ref 0.0–0.5)
Eosinophils Relative: 1 %
HCT: 44.8 % (ref 39.0–52.0)
Hemoglobin: 15.2 g/dL (ref 13.0–17.0)
Immature Granulocytes: 1 %
Lymphocytes Relative: 36 %
Lymphs Abs: 2 K/uL (ref 0.7–4.0)
MCH: 30.9 pg (ref 26.0–34.0)
MCHC: 33.9 g/dL (ref 30.0–36.0)
MCV: 91.1 fL (ref 80.0–100.0)
Monocytes Absolute: 0.5 K/uL (ref 0.1–1.0)
Monocytes Relative: 8 %
Neutro Abs: 3 K/uL (ref 1.7–7.7)
Neutrophils Relative %: 53 %
Platelets: 192 K/uL (ref 150–400)
RBC: 4.92 MIL/uL (ref 4.22–5.81)
RDW: 12.9 % (ref 11.5–15.5)
WBC: 5.7 K/uL (ref 4.0–10.5)
nRBC: 0 % (ref 0.0–0.2)

## 2024-05-18 LAB — COMPREHENSIVE METABOLIC PANEL WITH GFR
ALT: 140 U/L — ABNORMAL HIGH (ref 0–44)
AST: 66 U/L — ABNORMAL HIGH (ref 15–41)
Albumin: 3.2 g/dL — ABNORMAL LOW (ref 3.5–5.0)
Alkaline Phosphatase: 109 U/L (ref 38–126)
Anion gap: 9 (ref 5–15)
BUN: 8 mg/dL (ref 6–20)
CO2: 25 mmol/L (ref 22–32)
Calcium: 8.2 mg/dL — ABNORMAL LOW (ref 8.9–10.3)
Chloride: 97 mmol/L — ABNORMAL LOW (ref 98–111)
Creatinine, Ser: 0.73 mg/dL (ref 0.61–1.24)
GFR, Estimated: >60 mL/min (ref >60)
Glucose, Bld: 496 mg/dL — ABNORMAL HIGH (ref 70–99)
Potassium: 4.2 mmol/L (ref 3.5–5.1)
Sodium: 131 mmol/L — ABNORMAL LOW (ref 135–145)
Total Bilirubin: 1.2 mg/dL (ref 0.0–1.2)
Total Protein: 5.5 g/dL — ABNORMAL LOW (ref 6.5–8.1)

## 2024-05-18 LAB — CK: Total CK: 52 U/L (ref 49–397)

## 2024-05-18 LAB — LIPASE, BLOOD: Lipase: 17 U/L (ref 11–51)

## 2024-05-18 NOTE — ED Provider Triage Note (Signed)
 Emergency Medicine Provider Triage Evaluation Note  Karl Torres , a 48 y.o. male  was evaluated in triage.  Pt complains of generalized bodyaches cough belly pain headache.  No contacts with similar symptoms, no recent traumas..  Review of Systems  Positive: aches Negative: fever  Physical Exam  BP 104/65   Pulse 72   Temp 98.6 F (37 C)   Resp 16   SpO2 99%  Gen:   Awake, no distress   Resp:  Normal effort  MSK:   Moves extremities without difficulty Other:    Medical Decision Making  Medically screening exam initiated at 7:23 PM.  Appropriate orders placed.  Karl Torres was informed that the remainder of the evaluation will be completed by another provider, this initial triage assessment does not replace that evaluation, and the importance of remaining in the ED until their evaluation is complete.     Arloa Chroman, PA-C 05/18/24 [id number]

## 2024-05-18 NOTE — ED Triage Notes (Signed)
 Pt ambulatory to ED C/O generalized body aches, chills X 2 weeks

## 2024-05-19 ENCOUNTER — Emergency Department (HOSPITAL_COMMUNITY): Payer: Self-pay

## 2024-05-19 LAB — CBG MONITORING, ED
Glucose-Capillary: 365 mg/dL — ABNORMAL HIGH (ref 70–99)
Glucose-Capillary: 476 mg/dL — ABNORMAL HIGH (ref 70–99)

## 2024-05-19 MED ORDER — INSULIN ASPART 100 UNIT/ML IV SOLN
10.0000 [IU] | Freq: Once | INTRAVENOUS | Status: AC
  Administered 2024-05-19: 10 [IU] via SUBCUTANEOUS

## 2024-05-19 MED ORDER — IBUPROFEN 400 MG PO TABS
400.0000 mg | ORAL_TABLET | Freq: Once | ORAL | Status: AC
  Administered 2024-05-19: 400 mg via ORAL
  Filled 2024-05-19: qty 1

## 2024-05-19 NOTE — Discharge Instructions (Signed)
Your caregiver has seen you today because you are having problems with feelings of weakness, dizziness, and/or fatigue. Weakness has many different causes, some of which are common and others are very rare. Your caregiver has considered some of the most common causes of weakness and feels it is safe for you to go home and be observed. Not every illness or injury can be identified during an emergency department visit, thus follow-up with your primary healthcare provider is important. Medical conditions can also worsen, so it is also important to return immediately as directed below, or if you have other serious concerns develop. ° °RETURN IMMEDIATELY IF  °you develop new shortness of breath, chest pain, fever, have difficulty moving parts of your body (new weakness, numbness, or incoordination), sudden change in speech, vision, swallowing, or understanding, faint or develop new dizziness, severe headache, become poorly responsive or have an altered mental status compared to baseline for you, new rash, abdominal pain, or bloody stools,  °Return sooner also if you develop new problems for which you have not talked to your caregiver but you feel may be emergency medical conditions. ° °

## 2024-05-19 NOTE — ED Provider Notes (Signed)
 [place] EMERGENCY DEPARTMENT AT [facility] Provider Note   CSN: [id number] Arrival date & time: 05/18/24  1730     Patient presents with: Generalized Body Aches   Karl Torres is a 48 y.o. male.   The history is provided by the patient.  Patient with history of diabetes, alcohol-induced chronic pancreatitis reports he need some pain pills. Patient reports ongoing diffuse body pain for up to 2 weeks.  He reports chills, headache and cough.  He also reports shortness of breath.  No fevers or vomiting.  No chest pain.  He reports mild dizziness that has been ongoing for weeks.  Reports chronic left leg weakness for over a year Patient reports he is compliant with his medications including insulin .  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 Authorizing Provider  acetaminophen  (TYLENOL ) 500 MG tablet Take 2 tablets (1,000 mg total) by mouth every 6 (six) hours as needed for headache, moderate pain (pain score 4-6) or mild pain (pain score 1-3). 05/01/24   Waymond Cart, MD  aspirin  EC 81 MG tablet Take 1 tablet (81 mg total) by mouth daily. Swallow whole. Patient not taking: Reported on 03/16/2024 09/17/23   [name], [name], NP  atorvastatin  (LIPITOR) 10 MG tablet Take 1 tablet (10 mg total) by mouth daily. Patient not taking: Reported on 03/16/2024 09/17/23   [name], [name], NP  Blood Glucose Monitoring Suppl DEVI 1 each by Does not apply route in the morning, at noon, and at bedtime. May substitute to any manufacturer covered by patient's insurance. 01/31/24   Steinl, Kevin, MD  docusate sodium  (COLACE) 100 MG capsule Take 1 capsule (100 mg total) by mouth daily. 05/01/24   Waymond Cart, MD  fluticasone  (FLONASE ) 50 MCG/ACT nasal spray  Place 2 sprays into both nostrils daily. Patient not taking: Reported on 03/16/2024 09/17/23   [name], [name], NP  folic acid  (FOLVITE ) 1 MG tablet Take 1 tablet (1 mg total) by mouth daily. 03/18/24   Gonfa, Taye T, MD  gabapentin  (NEURONTIN ) 300 MG capsule Take 1 capsule (300 mg total) by mouth at bedtime. 05/01/24   Waymond Cart, MD  Glucose Blood (BLOOD GLUCOSE TEST STRIPS) STRP 1 each by In Vitro route in the morning, at noon, and at bedtime. May substitute to any manufacturer covered by patient's insurance. Patient not taking: Reported on 03/16/2024 09/17/23   [name], [name], NP  insulin  isophane & regular human KwikPen (HUMULIN 70/30 MIX) (70-30) 100 UNIT/ML KwikPen Inject 15 Units into the skin 2 (two) times daily before lunch and supper. 05/01/24   Waymond Cart, MD  Insulin  Pen Needle 32G X 4 MM MISC Use 1 needle in the morning and at bedtime with insulin  pen. 03/18/24   Gonfa, Taye T, MD  metFORMIN  (GLUCOPHAGE ) 1000 MG tablet Take 1 tablet (1,000 mg total) by mouth 2 (two) times daily with a meal. 03/18/24   Kathrin Mignon DASEN, MD  Multiple Vitamin (MULTIVITAMIN WITH MINERALS) TABS tablet Take 1 tablet by mouth daily. Patient not taking: Reported on 04/29/2024 03/18/24   Gonfa, Taye T, MD  nicotine  (NICODERM CQ  - DOSED IN MG/24 HOURS) 14 mg/24hr patch Place 1 patch (14 mg total) onto the skin daily. Patient not  taking: Reported on 04/29/2024 03/18/24   Gonfa, Taye T, MD  polyethylene glycol powder (GLYCOLAX /MIRALAX ) 17 GM/SCOOP powder Dissolve 1 capful (17g) in 4-8 ounces of liquid and take by mouth 2 (two) times daily. 05/01/24   Waymond Cart, MD  senna (SENOKOT) 8.6 MG TABS tablet Take 1 tablet (8.6 mg total) by mouth 2 (two) times daily. 05/01/24   Waymond Cart, MD  thiamine  (VITAMIN B1) 100 MG tablet Take 1 tablet (100 mg total) by mouth daily. 03/18/24   Gonfa, Taye T, MD    Allergies: Patient has no known allergies.    Review of Systems  Constitutional:  Positive for fatigue. Negative for fever.   Respiratory:  Positive for cough and shortness of breath.   Cardiovascular:  Negative for chest pain.  Musculoskeletal:  Positive for arthralgias and myalgias.  Neurological:  Positive for headaches.    Updated Vital Signs BP 105/85 (BP Location: Right Arm)   Pulse 72   Temp 97.9 F (36.6 C) (Oral)   Resp 16   SpO2 100%   Physical Exam CONSTITUTIONAL: Disheveled, resting comfortably, appears asleep when I approached the bedside HEAD: Normocephalic/atraumatic EYES: EOMI/PERRL ENMT: Mucous membranes moist NECK: supple no meningeal signs CV: S1/S2 noted, no murmurs/rubs/gallops noted LUNGS: Lungs are clear to auscultation bilaterally, no apparent distress ABDOMEN: soft NEURO: Pt is awake/alert/appropriate, moves all extremitiesx4.  No facial droop.  No arm or leg drift.  Patient is able to ambulate without difficulty EXTREMITIES: pulses normal/equal, full ROM Extremities appear well-perfused.  There is noted edema.  No deformities or significant tenderness. SKIN: warm, color normal PSYCH: no abnormalities of mood noted, alert and oriented to situation  (all labs ordered are listed, but only abnormal results are displayed) Labs Reviewed  COMPREHENSIVE METABOLIC PANEL WITH GFR - Abnormal; Notable for the following components:      Result Value   Sodium 131 (*)    Chloride 97 (*)    Glucose, Bld 496 (*)    Calcium  8.2 (*)    Total Protein 5.5 (*)    Albumin 3.2 (*)    AST 66 (*)    ALT 140 (*)    All other components within normal limits  CBG MONITORING, ED - Abnormal; Notable for the following components:   Glucose-Capillary 365 (*)    All other components within normal limits  RESP PANEL BY RT-PCR (RSV, FLU A&B, COVID)  [id number]  CBC WITH DIFFERENTIAL/PLATELET  LIPASE, BLOOD  CK    EKG: ED ECG REPORT   Date: 05/19/2024 0240am  Rate: 57  Rhythm: sinus bradycardia  QRS Axis: normal  Intervals: normal  ST/T Wave abnormalities: nonspecific ST changes  Conduction  Disutrbances:none     I have personally reviewed the EKG tracing and agree with the computerized printout as noted.   Radiology: DG Chest 2 View Result Date: 05/19/2024 EXAM: 2 VIEW(S) XRAY OF THE CHEST 05/19/2024 01:20:00 AM COMPARISON: 03/16/2024 CLINICAL HISTORY: sob. Pt ambulatory to ED C/O generalized body aches, chills X 2 weeks FINDINGS: LUNGS AND PLEURA: No focal pulmonary opacity. No pulmonary edema. No pleural effusion. No pneumothorax. HEART AND MEDIASTINUM: No acute abnormality of the cardiac and mediastinal silhouettes. BONES AND SOFT TISSUES: Mild degenerative changes of the mid thoracic spine. IMPRESSION: 1. No acute abnormalities. Electronically signed by: Pinkie Pebbles MD 05/19/2024 01:24 AM EDT RP Workstation: [id number]     Procedures   Medications Ordered in the ED  ibuprofen  (ADVIL ) tablet 400 mg (400 mg Oral Given 05/19/24 0258)  insulin  aspart (novoLOG )  injection 10 Units (10 Units Subcutaneous Given 05/19/24 0259)    Clinical Course as of 05/19/24 0347  Fri May 19, 2024  0043 Glucose(!): 496 Hyperglycemia without anion gap [DW]  0347 Patient presenting for multiple complaints still likely due to his underlying diabetes that is not well-controlled Patient monitored for several hours.  He was given insulin .  Upon discharge patient asking for multiple sandwiches  Given referrals to PCP Patient claims he has all of his meds at home [DW]    Clinical Course User Index [DW] Midge Golas, MD                                 Medical Decision Making Amount and/or Complexity of Data Reviewed Labs:  Decision-making details documented in ED Course. Radiology: ordered. ECG/medicine tests: ordered.  Risk OTC drugs. Prescription drug management.   Patient presents with multiple complaints including arthralgias, myalgias, cough, shortness of breath and dizziness. He is overall in no acute distress.  He has significant social determinants of health including  lack of access to PCP and recent job loss. Patient reports medication compliance and his glucose is well over 400.  Labs reveal hyperglycemia without anion gap. His exam is overall unremarkable. His symptoms may be multifactorial including dehydration, uncontrolled diabetes as well as an intercurrent viral illness that is resolving     Final diagnoses:  Dizziness  Hyperglycemia  Shortness of breath    ED Discharge Orders     None          Midge Golas, MD 05/19/24 [phone]

## 2024-05-19 NOTE — ED Notes (Signed)
 Pt demanding [place] sandwich and peanut butter crackers. Educated on proper blood sugar control. Pt refusing to comply and continued to eat sandwich and crackers.

## 2024-06-28 ENCOUNTER — Encounter: Payer: Self-pay | Admitting: Nurse Practitioner

## 2024-07-06 LAB — HEMOGLOBIN A1C: Hemoglobin A1C: >15.5

## 2024-07-06 LAB — AMB RESULTS CONSOLE CBG: Glucose: 501

## 2024-07-11 NOTE — Progress Notes (Signed)
 Pt screened for HTN, DM, and SDOH needs. A1C was too high for the machine to read, random glucose was 501. Educated pt on medication management, the importance of compliance with his medication. He stated he has insulin  at home but has not taken it. Has upcoming appt scheduled with new PCP

## 2024-07-18 ENCOUNTER — Other Ambulatory Visit (HOSPITAL_COMMUNITY): Payer: Self-pay

## 2024-07-18 ENCOUNTER — Encounter: Payer: Self-pay | Admitting: Nurse Practitioner

## 2024-07-18 ENCOUNTER — Ambulatory Visit: Payer: Self-pay | Admitting: Nurse Practitioner

## 2024-07-18 ENCOUNTER — Other Ambulatory Visit: Payer: Self-pay

## 2024-07-18 ENCOUNTER — Other Ambulatory Visit: Payer: Self-pay | Admitting: Nurse Practitioner

## 2024-07-18 VITALS — BP 106/77 | HR 77 | Wt 142.8 lb

## 2024-07-18 DIAGNOSIS — K860 Alcohol-induced chronic pancreatitis: Secondary | ICD-10-CM

## 2024-07-18 DIAGNOSIS — Z5941 Food insecurity: Secondary | ICD-10-CM | POA: Insufficient documentation

## 2024-07-18 DIAGNOSIS — E1165 Type 2 diabetes mellitus with hyperglycemia: Secondary | ICD-10-CM

## 2024-07-18 DIAGNOSIS — F1090 Alcohol use, unspecified, uncomplicated: Secondary | ICD-10-CM

## 2024-07-18 DIAGNOSIS — M51369 Other intervertebral disc degeneration, lumbar region without mention of lumbar back pain or lower extremity pain: Secondary | ICD-10-CM | POA: Insufficient documentation

## 2024-07-18 DIAGNOSIS — E1142 Type 2 diabetes mellitus with diabetic polyneuropathy: Secondary | ICD-10-CM

## 2024-07-18 DIAGNOSIS — K8689 Other specified diseases of pancreas: Secondary | ICD-10-CM | POA: Insufficient documentation

## 2024-07-18 DIAGNOSIS — M51361 Other intervertebral disc degeneration, lumbar region with lower extremity pain only: Secondary | ICD-10-CM

## 2024-07-18 DIAGNOSIS — I10 Essential (primary) hypertension: Secondary | ICD-10-CM

## 2024-07-18 DIAGNOSIS — K529 Noninfective gastroenteritis and colitis, unspecified: Secondary | ICD-10-CM

## 2024-07-18 DIAGNOSIS — F17200 Nicotine dependence, unspecified, uncomplicated: Secondary | ICD-10-CM | POA: Insufficient documentation

## 2024-07-18 DIAGNOSIS — M5030 Other cervical disc degeneration, unspecified cervical region: Secondary | ICD-10-CM

## 2024-07-18 DIAGNOSIS — Z87898 Personal history of other specified conditions: Secondary | ICD-10-CM | POA: Insufficient documentation

## 2024-07-18 DIAGNOSIS — K862 Cyst of pancreas: Secondary | ICD-10-CM

## 2024-07-18 DIAGNOSIS — G8929 Other chronic pain: Secondary | ICD-10-CM | POA: Insufficient documentation

## 2024-07-18 DIAGNOSIS — K8681 Exocrine pancreatic insufficiency: Secondary | ICD-10-CM

## 2024-07-18 DIAGNOSIS — E785 Hyperlipidemia, unspecified: Secondary | ICD-10-CM

## 2024-07-18 DIAGNOSIS — Z59819 Housing instability, housed unspecified: Secondary | ICD-10-CM

## 2024-07-18 LAB — POCT URINE DIPSTICK
Bilirubin, UA: NEGATIVE
Blood, UA: NEGATIVE
Glucose, UA: >=1000 mg/dL — AB
Ketones, POC UA: NEGATIVE mg/dL
Leukocytes, UA: NEGATIVE
Nitrite, UA: NEGATIVE
POC PROTEIN,UA: NEGATIVE
Spec Grav, UA: 1.015 (ref 1.010–1.025)
Urobilinogen, UA: 0.2 U/dL
pH, UA: 5 (ref 5.0–8.0)

## 2024-07-18 LAB — POCT GLUCOSE FINGERSTICK: Glucose: 509 — AB (ref 70–99)

## 2024-07-18 LAB — GLUCOSE, POCT (MANUAL RESULT ENTRY): POC Glucose: 416 mg/dL — AB (ref 70–99)

## 2024-07-18 MED ORDER — LANCET DEVICE MISC
1.0000 | 0 refills | Status: AC
Start: 2024-07-18 — End: ?

## 2024-07-18 MED ORDER — ATORVASTATIN CALCIUM 10 MG PO TABS: 10.0000 mg | ORAL_TABLET | Freq: Every day | ORAL | 1 refills | Status: DC

## 2024-07-18 MED ORDER — INSULIN REGULAR HUMAN 100 UNIT/ML IJ SOLN
20.0000 [IU] | Freq: Once | INTRAMUSCULAR | Status: AC
  Administered 2024-07-18: 20 [IU] via SUBCUTANEOUS

## 2024-07-18 MED ORDER — INSULIN PEN NEEDLE 32G X 4 MM MISC: 1.0000 | Freq: Every day | 2 refills | Status: AC

## 2024-07-18 MED ORDER — GABAPENTIN 300 MG PO CAPS: 300.0000 mg | ORAL_CAPSULE | Freq: Every day | ORAL | 1 refills | Status: AC

## 2024-07-18 MED ORDER — CREON 24000-76000 UNITS PO CPEP: ORAL_CAPSULE | ORAL | 3 refills | Status: DC

## 2024-07-18 MED ORDER — METFORMIN HCL ER 500 MG PO TB24: 500.0000 mg | ORAL_TABLET | Freq: Two times a day (BID) | ORAL | 1 refills | Status: AC

## 2024-07-18 MED ORDER — INSULIN ASPART 100 UNIT/ML IJ SOLN: 20.0000 [IU] | Freq: Once | INTRAMUSCULAR | Status: DC

## 2024-07-18 MED ORDER — MELOXICAM 7.5 MG PO TABS: 7.5000 mg | ORAL_TABLET | Freq: Every day | ORAL | 1 refills | Status: AC | PRN

## 2024-07-18 MED ORDER — BLOOD GLUCOSE MONITORING SUPPL DEVI
1.0000 | 0 refills | Status: AC
Start: 2024-07-18 — End: ?

## 2024-07-18 MED ORDER — INSULIN REGULAR HUMAN 100 UNIT/ML IJ SOLN: 20.0000 [IU] | Freq: Once | INTRAMUSCULAR | Status: DC

## 2024-07-18 MED ORDER — BLOOD GLUCOSE TEST VI STRP: 1.0000 | ORAL_STRIP | 0 refills | Status: AC

## 2024-07-18 MED ORDER — LANCETS MISC
1.0000 | 0 refills | Status: AC
Start: 2024-07-18 — End: ?

## 2024-07-18 MED ORDER — THIAMINE HCL 100 MG PO TABS: 100.0000 mg | ORAL_TABLET | Freq: Every day | ORAL | 0 refills | Status: AC

## 2024-07-18 MED ORDER — FOLIC ACID 1 MG PO TABS: 1.0000 mg | ORAL_TABLET | Freq: Every day | ORAL | 3 refills | Status: AC

## 2024-07-18 MED ORDER — LANTUS SOLOSTAR 100 UNIT/ML ~~LOC~~ SOPN: 10.0000 [IU] | PEN_INJECTOR | Freq: Every day | SUBCUTANEOUS | 3 refills | Status: DC

## 2024-07-18 NOTE — Assessment & Plan Note (Signed)
 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 Previous imaging shows mild disease at C5-C6. Pain management with gabapentin  and Tylenol  ineffective. - Consider referral to orthopedic specialist for further evaluation and management. - Advised use of heating pad for symptomatic relief. Prescibed meloxicam  7.5mg  daily PRN , take with food  Continue gabapentin  300mg  at bedtime

## 2024-07-18 NOTE — Assessment & Plan Note (Addendum)
 Resources on find [email or web address] provided, referred to the social worker placed

## 2024-07-18 NOTE — Assessment & Plan Note (Addendum)
  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 Advised abstinence to prevent exacerbation. - Advised complete abstinence from alcohol. Creon  ordered for pancreatic insufficiency  Will refer to GI

## 2024-07-18 NOTE — Assessment & Plan Note (Signed)
 Needs follow up CT abd/pelv with contrast in 1 year.

## 2024-07-18 NOTE — Assessment & Plan Note (Signed)
 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 Pain severe and persistent, affecting daily activities. Previous use of ibuprofen  and Tylenol  ineffective. -referral placed to orthopedic specialist for further evaluation and management. Meloxicam  7.5 mg daily as needed ordered take with food

## 2024-07-18 NOTE — Assessment & Plan Note (Addendum)
 Food from the clinic pantry provided.Resources on find [email or web address] provided, referred to the social worker placed

## 2024-07-18 NOTE — Patient Instructions (Signed)
 Goal for fasting blood sugar ranges from 80 to 120 and 2 hours after any meal or at bedtime should be between 130 to 170.   1. Uncontrolled type 2 diabetes mellitus with hyperglycemia, with long-term current use of insulin  (HCC) (Primary)   - metFORMIN  (GLUCOPHAGE -XR) 500 MG 24 hr tablet; Take 1 tablet (500 mg total) by mouth 2 (two) times daily with a meal.  Dispense: 180 tablet; Refill: 1 - insulin  glargine (LANTUS  SOLOSTAR) 100 UNIT/ML Solostar Pen; Inject 10 Units into the skin daily.  Dispense: 15 mL; Refill: 3     Hyperlipidemia LDL goal <100  - atorvastatin  (LIPITOR) 10 MG tablet; Take 1 tablet (10 mg total) by mouth daily.  Dispense: 60 tablet; Refill: 1     Alcohol-induced chronic pancreatitis (HCC)  - Pancrelipase , Lip-Prot-Amyl, (CREON ) 24000-76000 units CPEP; Take 2 capsules three times per day with each meal. Take 1 capsule with snacks. Maximum 8 capsules per day.  Dispense: 200 capsule; Refill: 3   History of alcohol use disorder  - thiamine  (VITAMIN B1) 100 MG tablet; Take 1 tablet (100 mg total) by mouth daily.  Dispense: 90 tablet; Refill: 0 - folic acid  (FOLVITE ) 1 MG tablet; Take 1 tablet (1 mg total) by mouth daily.  Dispense: 90 tablet; Refill: 3  . Other chronic pain  - gabapentin  (NEURONTIN ) 300 MG capsule; Take 1 capsule (300 mg total) by mouth at bedtime.  Dispense: 90 capsule; Refill: 1 - meloxicam  (MOBIC ) 7.5 MG tablet; Take 1 tablet (7.5 mg total) by mouth daily as needed for pain.  Dispense: 30 tablet; Refill: 1    It is important that you exercise regularly at least 30 minutes 5 times a week as tolerated  Think about what you will eat, plan ahead. Choose  clean, green, fresh or frozen over canned, processed or packaged foods which are more sugary, salty and fatty. 70 to 75% of food eaten should be vegetables and fruit. Three meals at set times with snacks allowed between meals, but they must be fruit or vegetables. Aim to eat over a 12 hour period ,  example 7 am to 7 pm, and STOP after  your last meal of the day. Drink water,generally about 64 ounces per day, no other drink is as healthy. Fruit juice is best enjoyed in a healthy way, by EATING the fruit.  Thanks for choosing [facility] we consider it a privelige to serve you.

## 2024-07-18 NOTE — Assessment & Plan Note (Signed)
 Hyperlipidemia Hyperlipidemia, previously prescribed atorvastatin  but not filled. Importance of lipid management discussed. - Encouraged filling of atorvastatin  10mg  daily prescription. Lab Results  Component Value Date   CHOL 112 03/17/2024   HDL 49 03/17/2024   LDLCALC 47 03/17/2024   TRIG 79 03/17/2024   CHOLHDL 2.3 03/17/2024

## 2024-07-18 NOTE — Assessment & Plan Note (Signed)
 Patient recently hospitalized at [facility] from 04/29/24 to 05/01/24 for stercoral proctitis.  His pancreatic elastase was < 1, indicating several pancreatitic insufficiency.  The clinical pharmacist was contacted by ITMS, who discharged the patient, who is recommending that patient start Creon  but she has been unable to reach the patient until today   Creon  ordered, will refer to GI

## 2024-07-18 NOTE — Progress Notes (Addendum)
 New Patient Office Visit  Subjective:  Patient ID: Karl Torres, male    DOB: 12/20/1975  Age: 48 y.o. MRN: [id number]  CC:  Chief Complaint  Patient presents with   Establish Care    Possible GI problem thinks he also may have cancer, has no appetite, lost a lot of muscle mass, fatigue    transfer of care   Hospitalization Follow-up    Dizziness, hyperglycemia, SOB   Medication Management    Needs creon     Back Pain   Pain    Located in the occipital area of the head, vision is blurry    Medication Refill    Thiamine  and gabapentin     HPI   Discussed the use of AI scribe software for clinical note transcription with the patient, who gave verbal consent to proceed.  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  who presents to establish care.  He experiences uncontrolled diabetes with frequent urination, constant thirst, and hunger. His medication adherence is inconsistent due to forgetfulness and lifestyle challenges. He takes his 70/30 insulin  once at night and occasionally in the morning, though it is prescribed twice daily.He is also  on metformin  which he has not been taking consistently,  takes three times a week, His diet includes a high intake of cookies and fruit. He lacks a working corporate treasurer and has no insurance, complicating his ability to manage his condition effectively, though the clinical pharmacist was able to find out that the patient actually has an active and active health insurance  He experiences chronic diarrhea up to ten times a day, often triggered by eating. He has been taking stool softeners, which may exacerbate his symptoms.   He has a history of pancreatitis related to alcohol use, though he has abstained from alcohol for the past two months.  He  experiences chronic pain in his lower back, knees, and shins, described as bone pain. This pain has persisted for several months and is not relieved by gabapentin  or ibuprofen . He has difficulty sleeping due to the pain, getting only about four hours of sleep per night. He has a history of degenerative disc disease in the cervical and lumbar spine.  He is homeless, living in a shed provided by his cousin, and lacks consistent access to shelter. He smokes one pack of cigarettes a day, having started at age 19. He reports shortness of breath and wheezing, particularly at night, and experiences cold hands and feet. No drug use.  Assessment & Plan       Past Medical History:  Diagnosis Date   Diabetes mellitus without complication (HCC)    Hypertension    Obstipation 04/30/2024   Pancreatic cyst 04/30/2024   CT 04/29/24 Pancreas: Diffusely atrophied with coarse calcifications, without  significant change. The previously demonstrated 3.0 cm pancreatic  tail cyst currently measures 3 cm in maximum diameter on image  number 23/3.     Stercoral proctitis 04/29/2024    History reviewed. No pertinent surgical history.  Family History  Problem Relation Age of Onset   Clotting disorder Mother    Hypertension Mother    Lung cancer Mother    Hypertension Father    Cancer Father    Hypertension Sister    Diabetes Sister    Hypertension Sister    Mental illness Maternal Uncle  Clotting disorder Maternal Grandmother    Diabetes Paternal Grandmother     Social History   Socioeconomic History   Marital status: Single    Spouse name: Not on file   Number of children: Not on file   Years of education: Not on file   Highest education level: Not on file  Occupational History   Not on file  Tobacco Use   Smoking status: Every Day    Current packs/day: 0.50    Types: Cigarettes   Smokeless tobacco: Never  Vaping Use   Vaping status: Never Used  Substance and Sexual Activity    Alcohol use: Not Currently   Drug use: Never   Sexual activity: Not Currently  Other Topics Concern   Not on file  Social History Narrative   Homeless, living in cousin's shelter   Social Drivers of Health   Tobacco Use: High Risk (07/18/2024)   Patient History    Smoking Tobacco Use: Every Day    Smokeless Tobacco Use: Never    Passive Exposure: Not on file  Financial Resource Strain: Not on file  Food Insecurity: Food Insecurity Present (07/11/2024)   Epic    Worried About Programme Researcher, Broadcasting/film/video in the Last Year: Often true    [employer] of Food in the Last Year: Often true  Transportation Needs: No Transportation Needs (07/11/2024)   Epic    Lack of Transportation (Medical): No    Lack of Transportation (Non-Medical): No  Recent Concern: Transportation Needs - Unmet Transportation Needs (04/29/2024)   Epic    Lack of Transportation (Medical): Yes    Lack of Transportation (Non-Medical): Yes  Physical Activity: Not on file  Stress: Not on file  Social Connections: Not on file  Intimate Partner Violence: Not At Risk (07/11/2024)   Epic    Fear of Current or Ex-Partner: No    Emotionally Abused: No    Physically Abused: No    Sexually Abused: No  Depression (PHQ2-9): High Risk (07/18/2024)   Depression (PHQ2-9)    PHQ-2 Score: 16  Alcohol Screen: Not on file  Housing: High Risk (07/11/2024)   Epic    Unable to Pay for Housing in the Last Year: Yes    Number of Times Moved in the Last Year: 6    Homeless in the Last Year: Yes  Utilities: Not At Risk (07/11/2024)   Epic    Threatened with loss of utilities: No  Recent Concern: Utilities - At Risk (04/29/2024)   Epic    Threatened with loss of utilities: Yes  Health Literacy: Not on file    ROS Review of Systems  Constitutional:  Negative for appetite change, chills, fatigue and fever.  HENT:  Negative for congestion, postnasal drip, rhinorrhea and sneezing.   Respiratory:  Negative for cough, shortness of breath and  wheezing.   Cardiovascular:  Negative for chest pain, palpitations and leg swelling.  Gastrointestinal:  Positive for diarrhea. Negative for abdominal pain, constipation, nausea and vomiting.  Genitourinary:  Negative for difficulty urinating, dysuria, flank pain and frequency.  Musculoskeletal:  Positive for arthralgias and back pain.  Skin:  Negative for color change, pallor, rash and wound.  Neurological:  Positive for dizziness. Negative for facial asymmetry, weakness, numbness and headaches.  Psychiatric/Behavioral:  Negative for behavioral problems, confusion, self-injury and suicidal ideas.     Objective:   Today's Vitals: BP 106/77 (BP Location: Left Arm, Patient Position: Sitting, Cuff Size: Normal)   Pulse 77   Wt 142  lb 12.8 oz (64.8 kg)   SpO2 96%   BMI 21.71 kg/m   Physical Exam Vitals and nursing note reviewed.  Constitutional:      General: He is not in acute distress.    Appearance: Normal appearance. He is not ill-appearing, toxic-appearing or diaphoretic.  Eyes:     General: No scleral icterus.       Right eye: No discharge.        Left eye: No discharge.     Extraocular Movements: Extraocular movements intact.     Conjunctiva/sclera: Conjunctivae normal.  Cardiovascular:     Rate and Rhythm: Normal rate and regular rhythm.     Pulses: Normal pulses.     Heart sounds: Normal heart sounds. No murmur heard.    No friction rub. No gallop.  Pulmonary:     Effort: Pulmonary effort is normal. No respiratory distress.     Breath sounds: Normal breath sounds. No stridor. No wheezing, rhonchi or rales.  Chest:     Chest wall: No tenderness.  Abdominal:     General: There is no distension.     Palpations: Abdomen is soft.     Tenderness: There is no abdominal tenderness. There is no right CVA tenderness, left CVA tenderness or guarding.  Musculoskeletal:        General: Tenderness present. No swelling, deformity or signs of injury.     Right lower leg: No edema.      Left lower leg: No edema.     Comments: Tenderness on palpation of the spine ,bilateral knee and bilateral shin.  Skin is warm and dry no redness or swelling noted  Skin:    General: Skin is warm and dry.     Capillary Refill: Capillary refill takes less than 2 seconds.     Coloration: Skin is not jaundiced or pale.     Findings: No bruising, erythema or lesion.  Neurological:     Mental Status: He is alert and oriented to person, place, and time.     Motor: No weakness.     Gait: Gait normal.  Psychiatric:        Mood and Affect: Mood normal.        Behavior: Behavior normal.        Thought Content: Thought content normal.        Judgment: Judgment normal.     Assessment & Plan:   Problem List Items Addressed This Visit       Cardiovascular and Mediastinum   Essential hypertension   Relevant Medications   atorvastatin  (LIPITOR) 10 MG tablet     Digestive   Alcohol-induced chronic pancreatitis (HCC) (Chronic)    Chronic pancreatitis secondary to alcohol use. Advised abstinence to prevent exacerbation. - Advised complete abstinence from alcohol. Creon  ordered for pancreatic insufficiency  Will refer to GI          Relevant Medications   Pancrelipase , Lip-Prot-Amyl, (CREON ) 24000-76000 units CPEP   Other Relevant Orders   Ambulatory referral to Gastroenterology   Pancreatic cyst (Chronic)    Needs follow up CT abd/pelv with contrast in 1 year.       Chronic diarrhea   Chronic diarrhea Chronic diarrhea possibly exacerbated by current metformin  formulation. Reports frequent bowel movements. - Discontinued current metformin  formulation and switched to extended-release formulation. - Advised against use of stool softeners and laxatives. Creon  ordered for pancreatic insufficiency  Will refer to Gi        Relevant Orders   TSH  Ambulatory referral to Gastroenterology   Stool culture   C difficile Toxins A+B W/Rflx - [facility]   TSH (Completed)   Pancreatic  insufficiency   Patient recently hospitalized at [facility] from 04/29/24 to 05/01/24 for stercoral proctitis.  His pancreatic elastase was < 1, indicating several pancreatitic insufficiency.  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 Diabetic polyneuropathy with pain in lower back, knees, and shins. Risk of complications if uncontrolled. - Ordered Lantus  insulin  10 units daily. - Changed metformin  to extended-release formulation 500mg  BID - Ordered glucometer for blood glucose monitoring. - Referred to clinical pharmacist for medication assistance. - Provided dietary counseling to avoid sugar, sweets, soda, and high-carbohydrate foods. - Administered 20 units of regular insulin  for CGB of 509  unfortunately the CMA gave the patient juice and a sandwich after giving insulin  , so blood sugar was still elevated at 416 before leaving the office -UA obtained no ketones notes , counseled on low carb diet  Follow up in 2 weeks  Appreciate collaboration with the clinical pharmacist       Relevant Medications   metFORMIN  (GLUCOPHAGE -XR) 500 MG 24 hr tablet   Insulin  Pen Needle 32G X 4 MM MISC   Blood Glucose Monitoring Suppl DEVI   Glucose Blood (BLOOD GLUCOSE TEST STRIPS) STRP   Lancet Device MISC   Lancets  MISC   atorvastatin  (LIPITOR) 10 MG tablet   Other Relevant Orders   CMP14+EGFR (Completed)   POCT Glucose Fingerstick (Completed)   Direct LDL (Completed)   POCT URINE DIPSTICK (Completed)   POCT glucose (manual entry) (Completed)   Ambulatory referral to diabetic education   Diabetic peripheral neuropathy associated with type 2 diabetes mellitus (HCC) (Chronic)   Relevant Medications   metFORMIN  (GLUCOPHAGE -XR) 500 MG 24 hr tablet   gabapentin  (NEURONTIN ) 300 MG capsule   atorvastatin  (LIPITOR) 10 MG tablet     Musculoskeletal and Integument   Degenerative disc disease, cervical   Chronic degenerative disc disease of cervical and lumbar spine Chronic degenerative disc disease contributing to lower back pain. Previous imaging shows mild disease at C5-C6. Pain management with gabapentin  and Tylenol  ineffective. - Consider referral to orthopedic specialist for further evaluation and management. - Advised use of heating pad for symptomatic relief. Prescibed meloxicam  7.5mg  daily PRN , take with food  Continue gabapentin  300mg  at bedtime      Relevant Medications   gabapentin  (NEURONTIN ) 300 MG capsule   meloxicam  (MOBIC ) 7.5 MG tablet   Degenerative disc disease, lumbar   Chronic degenerative disc disease of cervical and lumbar spine Chronic degenerative disc disease contributing to lower back pain. Previous imaging shows mild disease at C5-C6. Pain management with gabapentin  and Tylenol  ineffective. - Consider referral to orthopedic specialist for further evaluation and management. - Advised use of heating pad for symptomatic relief. Prescibed meloxicam   7.5mg  daily PRN , take with food  Continue gabapentin  300mg  at bedtime      Relevant Medications   gabapentin  (NEURONTIN ) 300 MG capsule   meloxicam  (MOBIC ) 7.5 MG tablet     Other   Dyslipidemia, goal LDL below 70   Hyperlipidemia Hyperlipidemia, previously prescribed atorvastatin  but not filled. Importance of lipid  management discussed. - Encouraged filling of atorvastatin  10mg  daily prescription. Lab Results  Component Value Date   CHOL 112 03/17/2024   HDL 49 03/17/2024   LDLCALC 47 03/17/2024   TRIG 79 03/17/2024   CHOLHDL 2.3 03/17/2024           Relevant Medications   atorvastatin  (LIPITOR) 10 MG tablet   Food insecurity   Food from the clinic pantry provided.Resources on find [email or web address] provided, referred to the social worker placed      Relevant Orders   AMB Referral VBCI Care Management   Housing insecurity   Resources on find [email or web address] provided, referred to the social worker placed      Relevant Orders   AMB Referral VBCI Care Management   History of alcohol use disorder   Relevant Medications   thiamine  (VITAMIN B1) 100 MG tablet   folic acid  (FOLVITE ) 1 MG tablet   Chronic knee pain   Arthralgia and suspected degenerative joint disease of knees and lower extremities Arthralgia in knees and lower extremities, possibly due to degenerative joint disease. Pain severe and persistent, affecting daily activities. Previous use of ibuprofen  and Tylenol  ineffective. -referral placed to orthopedic specialist for further evaluation and management. Meloxicam  7.5 mg daily as needed ordered take with food       Relevant Medications   gabapentin  (NEURONTIN ) 300 MG capsule   meloxicam  (MOBIC ) 7.5 MG tablet   Tobacco use disorder   Tobacco use disorder Long-standing tobacco use disorder. Discussed risks of smoking, including lung cancer, heart disease, and stroke. - Encouraged smoking cessation.       Outpatient Encounter Medications as of 07/18/2024  Medication Sig   Blood Glucose Monitoring Suppl DEVI 1 each by Does not apply route as directed. Dispense based on patient and insurance preference. Use up to four times daily as directed. (FOR ICD-10 E10.9, E11.9).   Glucose Blood (BLOOD GLUCOSE TEST STRIPS) STRP 1 each by Does not apply route as directed. Dispense based on patient and  insurance preference. Use up to four times daily as directed. (FOR ICD-10 E10.9, E11.9).   Lancet Device MISC 1 each by Does not apply route as directed. Dispense based on patient and insurance preference. Use up to four times daily as directed. (FOR ICD-10 E10.9, E11.9).   Lancets MISC 1 each by Does not apply route as directed. Dispense based on patient and insurance preference. Use up to four times daily as directed. (FOR ICD-10 E10.9, E11.9).   meloxicam  (MOBIC ) 7.5 MG tablet Take 1 tablet (7.5 mg total) by mouth daily as needed for pain.   metFORMIN  (GLUCOPHAGE -XR) 500 MG 24 hr tablet Take 1 tablet (500 mg total) by mouth 2 (two) times daily with a meal.   Pancrelipase , Lip-Prot-Amyl, (CREON ) 24000-76000 units CPEP Take 2 capsules three times per day with each meal. Take 1 capsule with snacks. Maximum 8 capsules per day.   [DISCONTINUED] docusate sodium  (COLACE) 100 MG capsule Take 1 capsule (100 mg total) by mouth daily.   [DISCONTINUED] folic acid  (FOLVITE ) 1 MG tablet Take 1 tablet (1 mg total) by mouth daily.   [DISCONTINUED] gabapentin  (NEURONTIN ) 300 MG capsule Take 1  capsule (300 mg total) by mouth at bedtime.   [DISCONTINUED] insulin  glargine (LANTUS  SOLOSTAR) 100 UNIT/ML Solostar Pen Inject 10 Units into the skin daily.   [DISCONTINUED] insulin  isophane & regular human KwikPen (HUMULIN 70/30 MIX) (70-30) 100 UNIT/ML KwikPen Inject 15 Units into the skin 2 (two) times daily before lunch and supper.   [DISCONTINUED] Insulin  Pen Needle 32G X 4 MM MISC Use 1 needle in the morning and at bedtime with insulin  pen.   [DISCONTINUED] metFORMIN  (GLUCOPHAGE ) 1000 MG tablet Take 1 tablet (1,000 mg total) by mouth 2 (two) times daily with a meal.   [DISCONTINUED] polyethylene glycol powder (GLYCOLAX /MIRALAX ) 17 GM/SCOOP powder Dissolve 1 capful (17g) in 4-8 ounces of liquid and take by mouth 2 (two) times daily.   [DISCONTINUED] senna (SENOKOT) 8.6 MG TABS tablet Take 1 tablet (8.6 mg total) by mouth  2 (two) times daily.   [DISCONTINUED] thiamine  (VITAMIN B1) 100 MG tablet Take 1 tablet (100 mg total) by mouth daily.   acetaminophen  (TYLENOL ) 500 MG tablet Take 2 tablets (1,000 mg total) by mouth every 6 (six) hours as needed for headache, moderate pain (pain score 4-6) or mild pain (pain score 1-3). (Patient not taking: Reported on 07/18/2024)   aspirin  EC 81 MG tablet Take 1 tablet (81 mg total) by mouth daily. Swallow whole. (Patient not taking: Reported on 07/18/2024)   atorvastatin  (LIPITOR) 10 MG tablet Take 1 tablet (10 mg total) by mouth daily.   fluticasone  (FLONASE ) 50 MCG/ACT nasal spray Place 2 sprays into both nostrils daily. (Patient not taking: Reported on 07/18/2024)   folic acid  (FOLVITE ) 1 MG tablet Take 1 tablet (1 mg total) by mouth daily.   gabapentin  (NEURONTIN ) 300 MG capsule Take 1 capsule (300 mg total) by mouth at bedtime.   Glucose Blood (BLOOD GLUCOSE TEST STRIPS) STRP 1 each by In Vitro route in the morning, at noon, and at bedtime. May substitute to any manufacturer covered by patient's insurance. (Patient not taking: Reported on 07/18/2024)   Insulin  Pen Needle 32G X 4 MM MISC 1 Needle by Does not apply route daily.   Multiple Vitamin (MULTIVITAMIN WITH MINERALS) TABS tablet Take 1 tablet by mouth daily. (Patient not taking: Reported on 07/18/2024)   nicotine  (NICODERM CQ  - DOSED IN MG/24 HOURS) 14 mg/24hr patch Place 1 patch (14 mg total) onto the skin daily. (Patient not taking: Reported on 07/18/2024)   thiamine  (VITAMIN B1) 100 MG tablet Take 1 tablet (100 mg total) by mouth daily.   [DISCONTINUED] atorvastatin  (LIPITOR) 10 MG tablet Take 1 tablet (10 mg total) by mouth daily. (Patient not taking: Reported on 07/18/2024)   [DISCONTINUED] Blood Glucose Monitoring Suppl DEVI 1 each by Does not apply route in the morning, at noon, and at bedtime. May substitute to any manufacturer covered by patient's insurance. (Patient not taking: Reported on 07/18/2024)    [EXPIRED] insulin  regular (NOVOLIN R) 100 units/mL injection 20 Units    [DISCONTINUED] insulin  aspart (novoLOG ) injection 20 Units    No facility-administered encounter medications on file as of 07/18/2024.    Follow-up: Return in about 2 weeks (around 08/01/2024).   Karl Torres R Orley Lawry, FNP

## 2024-07-18 NOTE — Assessment & Plan Note (Addendum)
 Lab Results  Component Value Date   HGBA1C >15.5 07/06/2024   Type 2 diabetes mellitus with hyperglycemia and diabetic polyneuropathy Uncontrolled diabetes with hyperglycemia due to inconsistent medication adherence and poor dietary habits. Diabetic polyneuropathy with pain in lower back, knees, and shins. Risk of complications if uncontrolled. - Ordered Lantus  insulin  10 units daily. - Changed metformin  to extended-release formulation 500mg  BID - Ordered glucometer for blood glucose monitoring. - Referred to clinical pharmacist for medication assistance. - Provided dietary counseling to avoid sugar, sweets, soda, and high-carbohydrate foods. - Administered 20 units of regular insulin  for CGB of 509  unfortunately the CMA gave the patient juice and a sandwich after giving insulin  , so blood sugar was still elevated at 416 before leaving the office -UA obtained no ketones notes , counseled on low carb diet  Follow up in 2 weeks  Appreciate collaboration with the clinical pharmacist

## 2024-07-18 NOTE — Assessment & Plan Note (Signed)
 Tobacco use disorder Long-standing tobacco use disorder. Discussed risks of smoking, including lung cancer, heart disease, and stroke. - Encouraged smoking cessation.

## 2024-07-18 NOTE — Assessment & Plan Note (Signed)
 Chronic diarrhea Chronic diarrhea possibly exacerbated by current metformin  formulation. Reports frequent bowel movements. - Discontinued current metformin  formulation and switched to extended-release formulation. - Advised against use of stool softeners and laxatives. Creon  ordered for pancreatic insufficiency  Will refer to Gi

## 2024-07-19 ENCOUNTER — Ambulatory Visit: Payer: Self-pay | Admitting: Nurse Practitioner

## 2024-07-19 LAB — CMP14+EGFR
ALT: 135 IU/L — ABNORMAL HIGH (ref 0–44)
AST: 38 IU/L (ref 0–40)
Albumin: 4 g/dL — ABNORMAL LOW (ref 4.1–5.1)
Alkaline Phosphatase: 161 IU/L — ABNORMAL HIGH (ref 47–123)
BUN/Creatinine Ratio: 12 (ref 9–20)
BUN: 6 mg/dL (ref 6–24)
Bilirubin Total: 1 mg/dL (ref 0.0–1.2)
CO2: 26 mmol/L (ref 20–29)
Calcium: 9.1 mg/dL (ref 8.7–10.2)
Chloride: 93 mmol/L — ABNORMAL LOW (ref 96–106)
Creatinine, Ser: 0.49 mg/dL — ABNORMAL LOW (ref 0.76–1.27)
Globulin, Total: 1.7 g/dL (ref 1.5–4.5)
Glucose: 336 mg/dL — ABNORMAL HIGH (ref 70–99)
Potassium: 4.3 mmol/L (ref 3.5–5.2)
Sodium: 135 mmol/L (ref 134–144)
Total Protein: 5.7 g/dL — ABNORMAL LOW (ref 6.0–8.5)
eGFR: 126 mL/min/1.73 (ref >59)

## 2024-07-19 LAB — TSH: TSH: 1.06 u[IU]/mL (ref 0.450–4.500)

## 2024-07-19 LAB — LDL CHOLESTEROL, DIRECT: LDL Direct: 58 mg/dL (ref 0–99)

## 2024-07-28 ENCOUNTER — Telehealth: Payer: Self-pay

## 2024-07-28 ENCOUNTER — Other Ambulatory Visit (HOSPITAL_COMMUNITY): Payer: Self-pay

## 2024-07-28 NOTE — Progress Notes (Signed)
 Called [employer] to submit PA for Creon , as I was unable to submit via CoverMyMeds. Spoke with Gordy at [facility] who submitted PA over the phone for Creon  24000 unit capsules (Take 2 capsules TID with meals and 1 capsule with snacks, #240 capsules for 30ds).   Faxed additional notes/documentation to [phone] with coversheet including pt name, DOB, and name of medication.   Will follow-up for outcome and notify [facility] staff to look for further communication for insurance.   Lorain Baseman, PharmD [facility] [phone]

## 2024-07-31 ENCOUNTER — Telehealth: Payer: Self-pay

## 2024-07-31 NOTE — Progress Notes (Unsigned)
 Complex Care Management Note Care Guide Note  07/31/2024 Name: Karl Torres MRN: [id number] DOB: 28-Feb-1976   Complex Care Management Outreach Attempts: An unsuccessful telephone outreach was attempted today to offer the patient information about available complex care management services.  Follow Up Plan:  Additional outreach attempts will be made to offer the patient complex care management information and services.   Encounter Outcome:  No Answer-Left voicemail  Leotis Rase [facility], [facility] Guide  Direct Dial: [phone]  Fax [phone]

## 2024-08-01 NOTE — Progress Notes (Unsigned)
 Complex Care Management Note Care Guide Note  08/01/2024 Name: Karl Torres MRN: [id number] DOB: [birth year]   Complex Care Management Outreach Attempts: A second unsuccessful outreach was attempted today to offer the patient with information about available complex care management services.  Follow Up Plan:  Additional outreach attempts will be made to offer the patient complex care management information and services.   Encounter Outcome:  No Answer  Leotis Rase [facility], [facility] Guide  Direct Dial: [phone]  Fax [phone]

## 2024-08-02 NOTE — Progress Notes (Signed)
 Complex Care Management Note Care Guide Note  08/02/2024 Name: Karl Torres MRN: [id number] DOB: [birth year]   Complex Care Management Outreach Attempts: A third unsuccessful outreach was attempted today to offer the patient with information about available complex care management services.  Follow Up Plan:  No further outreach attempts will be made at this time. We have been unable to contact the patient to offer or enroll patient in complex care management services.  Encounter Outcome:  No Answer-Left Voicemail  Leotis Rase [facility], [facility] Guide  Direct Dial: [phone]  Fax [phone]

## 2024-08-07 NOTE — Addendum Note (Signed)
 Addended by: JUANICE THOMES SAUNDERS on: 08/07/2024 04:26 PM   Modules accepted: Level of Service

## 2024-08-08 ENCOUNTER — Telehealth: Payer: Self-pay

## 2024-08-08 NOTE — Telephone Encounter (Signed)
 Copied from CRM #[id number]. Topic: General - Other >> Aug 08, 2024  2:02 PM Geneva B wrote: Reason for CRM: patient is calling in needing transportation for his appt 08/09/2024 pt do not have insurance please call pt back   [phone]   [phone]

## 2024-08-09 ENCOUNTER — Ambulatory Visit: Payer: Self-pay | Admitting: Nurse Practitioner

## 2024-08-09 ENCOUNTER — Encounter: Payer: Self-pay | Admitting: Nurse Practitioner

## 2024-08-09 ENCOUNTER — Telehealth: Payer: Self-pay

## 2024-08-09 ENCOUNTER — Encounter: Payer: Self-pay | Admitting: Physician Assistant

## 2024-08-09 ENCOUNTER — Other Ambulatory Visit (HOSPITAL_COMMUNITY): Payer: Self-pay

## 2024-08-09 VITALS — BP 105/83 | HR 63 | Temp 97.1°F | Wt 137.6 lb

## 2024-08-09 DIAGNOSIS — Z5941 Food insecurity: Secondary | ICD-10-CM

## 2024-08-09 DIAGNOSIS — F17200 Nicotine dependence, unspecified, uncomplicated: Secondary | ICD-10-CM

## 2024-08-09 DIAGNOSIS — K529 Noninfective gastroenteritis and colitis, unspecified: Secondary | ICD-10-CM

## 2024-08-09 DIAGNOSIS — R748 Abnormal levels of other serum enzymes: Secondary | ICD-10-CM

## 2024-08-09 DIAGNOSIS — Z59819 Housing instability, housed unspecified: Secondary | ICD-10-CM

## 2024-08-09 DIAGNOSIS — F329 Major depressive disorder, single episode, unspecified: Secondary | ICD-10-CM | POA: Insufficient documentation

## 2024-08-09 DIAGNOSIS — E1165 Type 2 diabetes mellitus with hyperglycemia: Secondary | ICD-10-CM

## 2024-08-09 DIAGNOSIS — Z748 Other problems related to care provider dependency: Secondary | ICD-10-CM | POA: Insufficient documentation

## 2024-08-09 DIAGNOSIS — Z794 Long term (current) use of insulin: Secondary | ICD-10-CM

## 2024-08-09 DIAGNOSIS — F321 Major depressive disorder, single episode, moderate: Secondary | ICD-10-CM

## 2024-08-09 LAB — GLUCOSE, POCT (MANUAL RESULT ENTRY): POC Glucose: 347 mg/dL — AB (ref 70–99)

## 2024-08-09 MED ADMIN — Insulin NPH (Human) (Isophane) Inj 100 Unit/ML: 10 [IU] | SUBCUTANEOUS | @ 16:00:00 | NDC 00169183411

## 2024-08-09 NOTE — Assessment & Plan Note (Signed)
°  Living in a shed with family, contributing to depression and anxiety. Financial constraints limit access to a working phone, complicating communication with social services. - Referral to social worker attempted to address housing instability and improve access to resources, but contact was unsuccessful due to lack of working phone.  He is going to the social services department tomorrow to apply for Medicaid, advised to inquire about getting a phone as well as this is needed for communication between him and his healthcare providers

## 2024-08-09 NOTE — Assessment & Plan Note (Signed)
 Appointment scheduled with GI today

## 2024-08-09 NOTE — Assessment & Plan Note (Signed)
 Provided food from the clinic pantry He has been referred to the social worker

## 2024-08-09 NOTE — Patient Instructions (Addendum)
 [facility]  [facility]. Take 2 capsules with each meal and 1 capsule with snacks. The copay will be $5  Let me know if you have issues. My number is below.   [facility] [address] [place] [address] [phone]  Lorain Baseman, PharmD [facility] [phone]   It is important that you exercise regularly at least 30 minutes 5 times a week as tolerated  Think about what you will eat, plan ahead. Choose  clean, green, fresh or frozen over canned, processed or packaged foods which are more sugary, salty and fatty. 70 to 75% of food eaten should be vegetables and fruit. Three meals at set times with snacks allowed between meals, but they must be fruit or vegetables. Aim to eat over a 12 hour period , example 7 am to 7 pm, and STOP after  your last meal of the day. Drink water,generally about 64 ounces per day, no other drink is as healthy. Fruit juice is best enjoyed in a healthy way, by EATING the fruit.  Thanks for choosing [facility] we consider it a privelige to serve you.

## 2024-08-09 NOTE — Progress Notes (Signed)
 Established Patient Office Visit  Subjective:  Patient ID: Karl Torres, male    DOB: [birth year]  Age: 48 y.o. MRN: [id number]  CC:  Chief Complaint  Patient presents with   Medical Management of Chronic Issues   Diabetes    HPI     Discussed the use of AI scribe software for clinical note transcription with the patient, who gave verbal consent to proceed.  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  who presents with medication access issues and housing insecurity.  He is experiencing difficulty accessing his prescribed insulin  due to financial constraints, [facility]155 [facility], which he cannot afford. 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 He is using the remaining insulin  he has left, which he carries with him.  He experiences occasional shortness of breath and mild chest pain. He has a history of smoking since the age of [age] and is currently trying to quit, although he does not smoke a full pack a day.  He acknowledges feelings of depression and anxiety, which he attributes to his housing insecurity and loneliness. He reports no thoughts of self-harm. He lacks a working phone, which has hindered communication with social workers who have been trying to reach him.  Assessment & Plan    Past Medical History:  Diagnosis Date   Diabetes mellitus without complication (HCC)    Hypertension    Obstipation 04/30/2024   Pancreatic cyst 04/30/2024   CT 04/29/24 Pancreas: Diffusely atrophied with coarse calcifications, without  significant change. The previously demonstrated 3.0 cm pancreatic  tail cyst currently measures 3 cm in maximum diameter on image  number 23/3.      Stercoral proctitis 04/29/2024    History reviewed. No pertinent surgical history.  Family History  Problem Relation Age of Onset   Clotting disorder Mother    Hypertension Mother    Lung cancer Mother    Hypertension Father    Cancer Father    Hypertension Sister    Diabetes Sister    Hypertension Sister    Mental illness Maternal Uncle    Clotting disorder Maternal Grandmother    Diabetes Paternal Grandmother     Social History   Socioeconomic History   Marital status: Single    Spouse name: Not on file   Number of children: Not on file   Years of education: Not on file   Highest education level: Not on file  Occupational History   Not on file  Tobacco Use   Smoking status: Every Day    Current packs/day: 0.50    Types: Cigarettes   Smokeless tobacco: Never  Vaping Use   Vaping status: Never Used  Substance and Sexual Activity   Alcohol use: Not Currently   Drug use: Never   Sexual activity: Not Currently  Other Topics Concern   Not on file  Social History Narrative   Homeless, living in cousin's shelter   Social Drivers of Health   Tobacco Use: High Risk (08/09/2024)   Patient History    Smoking Tobacco Use: Every Day    Smokeless Tobacco Use: Never    Passive Exposure: Not on file  Financial Resource Strain: Not on file  Food Insecurity: Food Insecurity Present (08/09/2024)   Epic  Worried About Programme Researcher, Broadcasting/film/video in the Last Year: Often true    Barista in the Last Year: Often true  Transportation Needs: Unmet Transportation Needs (08/09/2024)   Epic    Lack of Transportation (Medical): Yes    Lack of Transportation (Non-Medical): Yes  Physical Activity: Not on file  Stress: Not on file  Social Connections: Not on file  Intimate Partner Violence: Not At Risk (08/09/2024)   Epic    Fear of Current or Ex-Partner: No    Emotionally Abused: No    Physically Abused: No    Sexually Abused: No  Depression (PHQ2-9): High Risk (07/18/2024)    Depression (PHQ2-9)    PHQ-2 Score: 16  Alcohol Screen: Not on file  Housing: High Risk (08/09/2024)   Epic    Unable to Pay for Housing in the Last Year: Yes    Number of Times Moved in the Last Year: 6    Homeless in the Last Year: Yes  Utilities: Patient Declined (08/09/2024)   Epic    Threatened with loss of utilities: Patient declined  Health Literacy: Not on file    Outpatient Medications Prior to Visit  Medication Sig Dispense Refill   acetaminophen  (TYLENOL ) 500 MG tablet Take 2 tablets (1,000 mg total) by mouth every 6 (six) hours as needed for headache, moderate pain (pain score 4-6) or mild pain (pain score 1-3). 30 tablet 0   folic acid  (FOLVITE ) 1 MG tablet Take 1 tablet (1 mg total) by mouth daily. 90 tablet 3   insulin  glargine (LANTUS  SOLOSTAR) 100 UNIT/ML Solostar Pen Inject 10 Units into the skin daily. 15 mL 3   Insulin  Pen Needle 32G X 4 MM MISC 1 Needle by Does not apply route daily. 100 each 2   metFORMIN  (GLUCOPHAGE -XR) 500 MG 24 hr tablet Take 1 tablet (500 mg total) by mouth 2 (two) times daily with a meal. 180 tablet 1   Multiple Vitamin (MULTIVITAMIN WITH MINERALS) TABS tablet Take 1 tablet by mouth daily.     Pancrelipase , Lip-Prot-Amyl, (CREON ) 24000-76000 units CPEP Take 2 capsules three times per day with each meal. Take 1 capsule with snacks. Maximum 8 capsules per day. 200 capsule 3   aspirin  EC 81 MG tablet Take 1 tablet (81 mg total) by mouth daily. Swallow whole. (Patient not taking: Reported on 08/09/2024) 30 tablet 5   atorvastatin  (LIPITOR) 10 MG tablet Take 1 tablet (10 mg total) by mouth daily. (Patient not taking: Reported on 08/09/2024) 60 tablet 1   Blood Glucose Monitoring Suppl DEVI 1 each by Does not apply route as directed. Dispense based on patient and insurance preference. Use up to four times daily as directed. (FOR ICD-10 E10.9, E11.9). (Patient not taking: Reported on 08/09/2024) 1 each 0   fluticasone  (FLONASE ) 50 MCG/ACT nasal spray  Place 2 sprays into both nostrils daily. (Patient not taking: Reported on 08/09/2024) 16 g 6   gabapentin  (NEURONTIN ) 300 MG capsule Take 1 capsule (300 mg total) by mouth at bedtime. (Patient not taking: Reported on 08/09/2024) 90 capsule 1   Glucose Blood (BLOOD GLUCOSE TEST STRIPS) STRP 1 each by In Vitro route in the morning, at noon, and at bedtime. May substitute to any manufacturer covered by patient's insurance. (Patient not taking: Reported on 08/09/2024) 100 strip 4   Glucose Blood (BLOOD GLUCOSE TEST STRIPS) STRP 1 each by Does not apply route as directed. Dispense based on patient and insurance preference. Use up to four times daily  as directed. (FOR ICD-10 E10.9, E11.9). (Patient not taking: Reported on 08/09/2024) 100 strip 0   Lancet Device MISC 1 each by Does not apply route as directed. Dispense based on patient and insurance preference. Use up to four times daily as directed. (FOR ICD-10 E10.9, E11.9). (Patient not taking: Reported on 08/09/2024) 1 each 0   Lancets MISC 1 each by Does not apply route as directed. Dispense based on patient and insurance preference. Use up to four times daily as directed. (FOR ICD-10 E10.9, E11.9). (Patient not taking: Reported on 08/09/2024) 100 each 0   meloxicam  (MOBIC ) 7.5 MG tablet Take 1 tablet (7.5 mg total) by mouth daily as needed for pain. (Patient not taking: Reported on 08/09/2024) 30 tablet 1   nicotine  (NICODERM CQ  - DOSED IN MG/24 HOURS) 14 mg/24hr patch Place 1 patch (14 mg total) onto the skin daily. (Patient not taking: Reported on 08/09/2024)     thiamine  (VITAMIN B1) 100 MG tablet Take 1 tablet (100 mg total) by mouth daily. (Patient not taking: Reported on 08/09/2024) 90 tablet 0   No facility-administered medications prior to visit.    Allergies[1]  ROS Review of Systems  Constitutional:  Negative for appetite change, chills, fatigue and fever.  HENT:  Negative for congestion, postnasal drip, rhinorrhea and sneezing.    Respiratory:  Negative for cough, shortness of breath and wheezing.   Cardiovascular:  Negative for chest pain, palpitations and leg swelling.  Gastrointestinal:  Positive for diarrhea. Negative for abdominal pain, constipation, nausea and vomiting.  Genitourinary:  Negative for difficulty urinating, dysuria, flank pain and frequency.  Musculoskeletal:  Negative for arthralgias, back pain, joint swelling and myalgias.  Skin:  Negative for color change, pallor and rash.  Neurological:  Negative for facial asymmetry, weakness, numbness and headaches.  Psychiatric/Behavioral:  Negative for behavioral problems, confusion, self-injury and suicidal ideas.       Objective:    Physical Exam Vitals and nursing note reviewed.  Constitutional:      General: He is not in acute distress.    Appearance: Normal appearance. He is not ill-appearing, toxic-appearing or diaphoretic.  Eyes:     General: No scleral icterus.       Right eye: No discharge.        Left eye: No discharge.     Extraocular Movements: Extraocular movements intact.     Conjunctiva/sclera: Conjunctivae normal.  Cardiovascular:     Rate and Rhythm: Normal rate and regular rhythm.     Pulses: Normal pulses.     Heart sounds: Normal heart sounds. No murmur heard.    No friction rub. No gallop.  Pulmonary:     Effort: Pulmonary effort is normal. No respiratory distress.     Breath sounds: Normal breath sounds. No stridor. No wheezing, rhonchi or rales.  Chest:     Chest wall: No tenderness.  Abdominal:     General: There is no distension.     Palpations: Abdomen is soft.     Tenderness: There is no abdominal tenderness. There is no right CVA tenderness, left CVA tenderness or guarding.  Musculoskeletal:        General: No deformity or signs of injury.     Right lower leg: No edema.     Left lower leg: No edema.  Skin:    General: Skin is warm and dry.     Capillary Refill: Capillary refill takes less than 2 seconds.      Coloration: Skin is not jaundiced or  pale.     Findings: No bruising, erythema or lesion.  Neurological:     Mental Status: He is alert and oriented to person, place, and time.     Motor: No weakness.     Gait: Gait normal.  Psychiatric:        Mood and Affect: Mood normal.        Behavior: Behavior normal.        Thought Content: Thought content normal.        Judgment: Judgment normal.     BP 105/83   Pulse 63   Temp (!) 97.1 F (36.2 C)   Wt 137 lb 9.6 oz (62.4 kg)   BMI 20.92 kg/m  Wt Readings from Last 3 Encounters:  08/09/24 137 lb 9.6 oz (62.4 kg)  07/18/24 142 lb 12.8 oz (64.8 kg)  05/01/24 142 lb 6.7 oz (64.6 kg)    Lab Results  Component Value Date   TSH 1.060 07/18/2024   Lab Results  Component Value Date   WBC 5.7 05/18/2024   HGB 15.2 05/18/2024   HCT 44.8 05/18/2024   MCV 91.1 05/18/2024   PLT 192 05/18/2024   Lab Results  Component Value Date   NA 135 07/18/2024   K 4.3 07/18/2024   CO2 26 07/18/2024   GLUCOSE 336 (H) 07/18/2024   BUN 6 07/18/2024   CREATININE 0.49 (L) 07/18/2024   BILITOT 1.0 07/18/2024   ALKPHOS 161 (H) 07/18/2024   AST 38 07/18/2024   ALT 135 (H) 07/18/2024   PROT 5.7 (L) 07/18/2024   ALBUMIN 4.0 (L) 07/18/2024   CALCIUM  9.1 07/18/2024   ANIONGAP 9 05/18/2024   EGFR 126 07/18/2024   Lab Results  Component Value Date   CHOL 112 03/17/2024   Lab Results  Component Value Date   HDL 49 03/17/2024   Lab Results  Component Value Date   LDLCALC 47 03/17/2024   Lab Results  Component Value Date   TRIG 79 03/17/2024   Lab Results  Component Value Date   CHOLHDL 2.3 03/17/2024   Lab Results  Component Value Date   HGBA1C >15.5 07/06/2024      Assessment & Plan:   Problem List Items Addressed This Visit       Digestive   Chronic diarrhea   Appointment scheduled with GI today       Relevant Orders   Stool culture   C difficile Toxins A+B W/Rflx - [facility]     Endocrine   Uncontrolled type 2  diabetes mellitus with hyperglycemia, with long-term current use of insulin  (HCC) (Chronic)    Difficulty accessing insulin  due to financial constraints. Currently using leftoverinsulin isophane & regular human KwikPen (HUMULIN 70/30   from previous supply, has not used insulin  today Blood pressure is well-controlled at 105/83 mmHg. - Consulted clinical pharmacist to arrange medication access and affordability options. Administered 10 units of regular insulin  for blood sugar of 347         Relevant Orders   POCT glucose (manual entry) (Completed)     Other   Food insecurity - Primary   Provided food from the clinic pantry He has been referred to the social worker      Housing insecurity    Living in a shed with family, contributing to depression and anxiety. Financial constraints limit access to a working phone, complicating communication with social services. - Referral to social worker attempted to address housing instability and improve access to resources, but contact was unsuccessful  due to lack of working phone.  He is going to the social services department tomorrow to apply for Medicaid, advised to inquire about getting a phone as well as this is needed for communication between him and his healthcare providers       Tobacco use disorder    Long-term smoker since age 3, currently smoking less than a pack per day. Acknowledges health risks associated with smoking. - Encouraged smoking cessation and discussed potential benefits for lung and heart health.      Major depression   Depression and anxiety Likely exacerbated by housing instability and loneliness. No suicidal ideation reported. Previous referral to social worker was unsuccessful due to lack of contact information. - There is no child psychotherapist available in the clinic.  Advised to inquire about getting a phone from social services tomorrow        Assistance needed with transportation   Refer to social  worker Going to social services tomorrow to apply for Medicaid      Other Visit Diagnoses       Elevated liver enzymes       Relevant Orders   Hepatic Function Panel   INR   Hepatitis B Surface Antigen (HBsAg)   Hep B Core Antibody   Hep B Surface Antibody   Hepatitis C antibody with reflex to PCR - [facility]   Gamma GT       No orders of the defined types were placed in this encounter.   Follow-up: Return in about 6 weeks (around 09/20/2024) for DM, HYPERLIPIDEMIA.    Declan Adamson R Candelario Steppe, FNP     [1] No Known Allergies

## 2024-08-09 NOTE — Assessment & Plan Note (Signed)
°  Difficulty accessing insulin  due to financial constraints. Currently using leftoverinsulin isophane & regular human KwikPen (HUMULIN 70/30   from previous supply, has not used insulin  today Blood pressure is well-controlled at 105/83 mmHg. - Consulted clinical pharmacist to arrange medication access and affordability options. Administered 10 units of regular insulin  for blood sugar of 347

## 2024-08-09 NOTE — Assessment & Plan Note (Signed)
 Refer to social worker Going to social services tomorrow to apply for [employer]

## 2024-08-09 NOTE — Assessment & Plan Note (Signed)
 Depression and anxiety Likely exacerbated by housing instability and loneliness. No suicidal ideation reported. Previous referral to social worker was unsuccessful due to lack of contact information. - There is no child psychotherapist available in the clinic.  Advised to inquire about getting a phone from social services tomorrow

## 2024-08-09 NOTE — Telephone Encounter (Addendum)
° °  [facility] confirmed [facility] can [facility] Creon  (#200) for $5 using above coupon card information. Original price > $2000. Patient aware.   Patient states he has 2 Novolog  70/30 pens remaining. He is taking 14 units once daily. Advised to continue this dose until we can troubleshoot insulin  access. $35/mo is not affordable for him as he does not have an income.   Attempted to call insurance to obtain information about plan and potentially cancel given high deductible of ~$8000, since patient does not know where this insurance came from. Called 3 numbers on the pharmacy card and was not able to reach anyone from the medical benefit. Appears to be an [employer] plan. Advised patient to visit social security office and apply for Medicaid.   Rescheduled pharmacy appt for 08/22/24, given office closure.   Lorain Baseman, PharmD [facility] [phone]

## 2024-08-09 NOTE — Assessment & Plan Note (Signed)
°  Long-term smoker since age 48, currently smoking less than a pack per day. Acknowledges health risks associated with smoking. - Encouraged smoking cessation and discussed potential benefits for lung and heart health.

## 2024-08-10 LAB — HEPATIC FUNCTION PANEL
ALT: 148 IU/L — ABNORMAL HIGH (ref 0–44)
AST: 69 IU/L — ABNORMAL HIGH (ref 0–40)
Albumin: 3.9 g/dL — ABNORMAL LOW (ref 4.1–5.1)
Alkaline Phosphatase: 137 IU/L — ABNORMAL HIGH (ref 47–123)
Bilirubin Total: 0.9 mg/dL (ref 0.0–1.2)
Bilirubin, Direct: 0.28 mg/dL (ref 0.00–0.40)
Total Protein: 5.7 g/dL — ABNORMAL LOW (ref 6.0–8.5)

## 2024-08-10 LAB — HCV INTERPRETATION

## 2024-08-10 LAB — HCV AB W REFLEX TO QUANT PCR: HCV Ab: NONREACTIVE

## 2024-08-10 LAB — PROTIME-INR
INR: 1 (ref 0.9–1.2)
Prothrombin Time: 10.9 s (ref 9.1–12.0)

## 2024-08-10 LAB — HEPATITIS B SURFACE ANTIBODY,QUALITATIVE: Hep B Surface Ab, Qual: NONREACTIVE

## 2024-08-10 LAB — HEPATITIS B SURFACE ANTIGEN: Hepatitis B Surface Ag: NEGATIVE

## 2024-08-10 LAB — GAMMA GT: GGT: 339 IU/L — ABNORMAL HIGH (ref 0–65)

## 2024-08-10 LAB — HEPATITIS B CORE ANTIBODY, IGM: Hep B C IgM: NEGATIVE

## 2024-08-10 NOTE — Telephone Encounter (Signed)
 Pt was seen in office . KH

## 2024-08-10 NOTE — Addendum Note (Signed)
 Addended by: VICTORY IHA on: 08/10/2024 04:11 PM   Modules accepted: Orders

## 2024-08-11 NOTE — Progress Notes (Addendum)
 The patient attended a screening event on 07/06/24 where her screening results were118/80, glucose 501mg /dl and J8R greater than 15 (too high for the machine). At the event the patient noted he is a smoker and live with someone who smokes also. Pt documented having no insurance and no pcp. Pt also noted having food, housing, transportation and ipv sdoh insecurities.  Per chart review the pt does have a pcp and the last ov with pcp was 08/04/24 for uncontrolled type 2 diabetes. Chart review indicates a few future appts including pcp and specialty. According to chart pt no longer has IPV insecurity. Chart review also indicated that pt has [employer] for insurance. This pt is already working with a care guide who has reached out three times by 07/31/24 and a care manager/lcsw Jasmine Lewis.  CHW will call pt to see if they would like sdoh resources mailed or emailed to them. CHW will also offer diabetes and healthy eating education. CHW called pt and received a your call can not be completed as dialed message. An abnormal results letter, blood sugar education, smoking and sdoh resources were sent via Mychart and email. An additional follow up will be done in according to the health equity team's protocol.

## 2024-08-11 NOTE — Progress Notes (Incomplete Revision)
 The patient attended a screening event on 07/06/24 where her screening results were118/80, glucose 501mg /dl and J8R greater than 15 (too high for the machine). At the event the patient noted he is a smoker and live with someone who smokes also. Pt documented having no insurance and no pcp. Pt also noted having food, housing, transportation and ipv sdoh insecurities.  Per chart review the pt does have a pcp and the last ov with pcp was 08/04/24 for uncontrolled type 2 diabetes. Chart review indicates a few future appts including pcp and specialty. No additional Health equity team support indicated at this time./An additional follow up will be done in according to the health equity team's protocol.  Glucose 501, bp enl, A1C greater than 15 too high for machine. Transportation, housing, food, smoker, send diabetes education and healthy eating, [phone]   Has a [facility] care manager/lcsw jasmine lewis, pt has insurance (tricare), transitions of care, pt has insulin  at home but hasn't taken it, consistent with appts, A1c was to high to read at the 11/18 event as well, care guide made 3 unsuccesfull outreach attempts by 07/31/24,

## 2024-08-13 LAB — STOOL CULTURE: E coli, Shiga toxin Assay: NEGATIVE

## 2024-08-13 LAB — C DIFFICILE, CYTOTOXIN B

## 2024-08-13 LAB — C DIFFICILE TOXINS A+B W/RFLX: C difficile Toxins A+B, EIA: NEGATIVE

## 2024-08-14 ENCOUNTER — Ambulatory Visit: Payer: Self-pay | Admitting: Nurse Practitioner

## 2024-08-15 NOTE — Progress Notes (Deleted)
 "  Chief Complaint: Chronic alcoholic pancreatitis  HPI:    Karl Torres is a 48 year old male with a past medical history as listed below including diabetes and pancreatic cyst, who was referred to me by Paseda, Folashade R, FNP for chronic alcoholic pancreatitis.    04/29/2024-05/01/2024 patient admitted to the hospital for stercoral proctitis.  At that point patient given a bowel regimen.  Discussed chronic pancreatitis and pseudocyst.    04/29/2024 CTAP for abdominal pain with prominent stool distending the rectum with diffuse low-density rectal wall thickening, compatible with stercoral proctitis, moderate stool elsewhere in the colon without thickening, stable changes of chronic pancreatitis and a stable 3 cm pancreatic tail cyst.  Discussed follow-up with the CT in a year.    04/29/2024 right upper quadrant ultrasound normal.    05/18/2024 CMP with an AST of 66, ALT 140, alk phos 109, normal T. bili at 1.2.  (04/29/2024 AST 63, ALT 146, alk phos 102, T. bili 1.3) (03/18/2024 AST 58, ALT 85, alk phos 81, T. bili 0.6)    08/09/2024 patient seen by PCP at that time discussed chronic diarrhea.  Had a stool culture and C. difficile ordered.  Forwarded to his PCP he was living in a shed with his family.  Receiving food from the food pantry.  Battling depression and anxiety.  Noted to have elevated liver enzymes and further labs were ordered.    08/09/24 C. difficile and stool culture negative.    08/09/2024 hepatic function panel with an alk phos of 137, AST 69 ALT 148.  T. bili 0.9.  Albumin low at 3.9.  INR normal at 1.0.  Hepatitis B and C testing negative.  GGT elevated at 339.  Past Medical History:  Diagnosis Date   Diabetes mellitus without complication (HCC)    Hypertension    Obstipation 04/30/2024   Pancreatic cyst 04/30/2024   CT 04/29/24 Pancreas: Diffusely atrophied with coarse calcifications, without  significant change. The previously demonstrated 3.0 cm pancreatic  tail cyst currently measures 3  cm in maximum diameter on image  number 23/3.     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  Current Outpatient Medications  Medication Sig Dispense Refill   acetaminophen  (TYLENOL ) 500 MG tablet Take 2 tablets (1,000 mg total) by mouth every 6 (six) hours as needed for headache, moderate pain (pain score 4-6) or mild pain (pain score 1-3). 30 tablet 0   aspirin  EC 81 MG tablet Take 1 tablet (81 mg total) by mouth daily. Swallow whole. (Patient not taking: Reported on 08/09/2024) 30 tablet 5   atorvastatin  (LIPITOR) 10 MG tablet Take 1 tablet (10 mg total) by mouth daily. (Patient not taking: Reported on 08/09/2024) 60 tablet 1   Blood Glucose Monitoring Suppl DEVI 1 each by Does not apply route as directed. Dispense based on patient and insurance preference. Use up to four times daily as directed. (FOR ICD-10 E10.9, E11.9). (Patient not taking: Reported on 08/09/2024) 1 each 0   fluticasone  (FLONASE ) 50 MCG/ACT nasal spray Place 2 sprays into both nostrils daily. (Patient not taking: Reported on 08/09/2024) 16 g 6   folic acid  (FOLVITE ) 1 MG tablet Take 1 tablet (1 mg total) by mouth daily. 90 tablet 3   gabapentin  (NEURONTIN ) 300 MG capsule Take 1 capsule (300 mg total) by mouth at bedtime. (Patient not taking: Reported on 08/09/2024) 90 capsule 1   Glucose Blood (BLOOD GLUCOSE TEST STRIPS) STRP 1 each by In Vitro route in  the morning, at noon, and at bedtime. May substitute to any manufacturer covered by patient's insurance. (Patient not taking: Reported on 08/09/2024) 100 strip 4   Glucose Blood (BLOOD GLUCOSE TEST STRIPS) STRP 1 each by Does not apply route as directed. Dispense based on patient and insurance preference. Use up to four times daily as directed. (FOR ICD-10 E10.9, E11.9). (Patient not taking: Reported on 08/09/2024) 100 strip 0   insulin  glargine (LANTUS  SOLOSTAR) 100 UNIT/ML Solostar Pen Inject 10 Units into the skin daily. 15 mL 3   Insulin  Pen Needle  32G X 4 MM MISC 1 Needle by Does not apply route daily. 100 each 2   Lancet Device MISC 1 each by Does not apply route as directed. Dispense based on patient and insurance preference. Use up to four times daily as directed. (FOR ICD-10 E10.9, E11.9). (Patient not taking: Reported on 08/09/2024) 1 each 0   Lancets MISC 1 each by Does not apply route as directed. Dispense based on patient and insurance preference. Use up to four times daily as directed. (FOR ICD-10 E10.9, E11.9). (Patient not taking: Reported on 08/09/2024) 100 each 0   meloxicam  (MOBIC ) 7.5 MG tablet Take 1 tablet (7.5 mg total) by mouth daily as needed for pain. (Patient not taking: Reported on 08/09/2024) 30 tablet 1   metFORMIN  (GLUCOPHAGE -XR) 500 MG 24 hr tablet Take 1 tablet (500 mg total) by mouth 2 (two) times daily with a meal. 180 tablet 1   Multiple Vitamin (MULTIVITAMIN WITH MINERALS) TABS tablet Take 1 tablet by mouth daily.     nicotine  (NICODERM CQ  - DOSED IN MG/24 HOURS) 14 mg/24hr patch Place 1 patch (14 mg total) onto the skin daily. (Patient not taking: Reported on 08/09/2024)     Pancrelipase , Lip-Prot-Amyl, (CREON ) 24000-76000 units CPEP Take 2 capsules three times per day with each meal. Take 1 capsule with snacks. Maximum 8 capsules per day. 200 capsule 3   thiamine  (VITAMIN B1) 100 MG tablet Take 1 tablet (100 mg total) by mouth daily. (Patient not taking: Reported on 08/09/2024) 90 tablet 0   No current facility-administered medications for this visit.    Allergies as of 08/16/2024   (No Known Allergies)    Family History  Problem Relation Age of Onset   Clotting disorder Mother    Hypertension Mother    Lung cancer Mother    Hypertension Father    Cancer Father    Hypertension Sister    Diabetes Sister    Hypertension Sister    Mental illness Maternal Uncle    Clotting disorder Maternal Grandmother    Diabetes Paternal Grandmother     Social History   Socioeconomic History   Marital status:  Single    Spouse name: Not on file   Number of children: Not on file   Years of education: Not on file   Highest education level: Not on file  Occupational History   Not on file  Tobacco Use   Smoking status: Every Day    Current packs/day: 0.50    Types: Cigarettes   Smokeless tobacco: Never  Vaping Use   Vaping status: Never Used  Substance and Sexual Activity   Alcohol use: Not Currently   Drug use: Never   Sexual activity: Not Currently  Other Topics Concern   Not on file  Social History Narrative   Homeless, living in [facility]   Social Drivers of Health   Tobacco Use: High Risk (08/09/2024)   Patient History  Smoking Tobacco Use: Every Day    Smokeless Tobacco Use: Never    Passive Exposure: Not on file  Financial Resource Strain: Not on file  Food Insecurity: Food Insecurity Present (08/09/2024)   Epic    Worried About Programme Researcher, Broadcasting/film/video in the Last Year: Often true    Barista in the Last Year: Often true  Transportation Needs: Unmet Transportation Needs (08/09/2024)   Epic    Lack of Transportation (Medical): Yes    Lack of Transportation (Non-Medical): Yes  Physical Activity: Not on file  Stress: Not on file  Social Connections: Not on file  Intimate Partner Violence: Not At Risk (08/09/2024)   Epic    Fear of Current or Ex-Partner: No    Emotionally Abused: No    Physically Abused: No    Sexually Abused: No  Depression (PHQ2-9): High Risk (07/18/2024)   Depression (PHQ2-9)    PHQ-2 Score: 16  Alcohol Screen: Not on file  Housing: High Risk (08/09/2024)   Epic    Unable to Pay for Housing in the Last Year: Yes    Number of Times Moved in the Last Year: 6    Homeless in the Last Year: Yes  Utilities: Patient Declined (08/09/2024)   Epic    Threatened with loss of utilities: Patient declined  Health Literacy: Not on file    Review of Systems:    Constitutional: No weight loss, fever, chills, weakness or fatigue HEENT: Eyes: No  change in vision               Ears, Nose, Throat:  No change in hearing or congestion Skin: No rash or itching Cardiovascular: No chest pain, chest pressure or palpitations   Respiratory: No SOB or cough Gastrointestinal: See HPI and otherwise negative Genitourinary: No dysuria or change in urinary frequency Neurological: No headache, dizziness or syncope Musculoskeletal: No new muscle or joint pain Hematologic: No bleeding or bruising Psychiatric: No history of depression or anxiety    Physical Exam:  Vital signs: There were no vitals taken for this visit.  Constitutional:   Pleasant Caucasian male appears to be in NAD, Well developed, Well nourished, alert and cooperative Head:  Normocephalic and atraumatic. Eyes:   PEERL, EOMI. No icterus. Conjunctiva pink. Ears:  Normal auditory acuity. Neck:  Supple Throat: Oral cavity and pharynx without inflammation, swelling or lesion.  Respiratory: Respirations even and unlabored. Lungs clear to auscultation bilaterally.   No wheezes, crackles, or rhonchi.  Cardiovascular: Normal S1, S2. No MRG. Regular rate and rhythm. No peripheral edema, cyanosis or pallor.  Gastrointestinal:  Soft, nondistended, nontender. No rebound or guarding. Normal bowel sounds. No appreciable masses or hepatomegaly. Rectal:  Not performed.  Msk:  Symmetrical without gross deformities. Without edema, no deformity or joint abnormality.  Neurologic:  Alert and  oriented x4;  grossly normal neurologically.  Skin:   Dry and intact without significant lesions or rashes. Psychiatric: Oriented to person, place and time. Demonstrates good judgement and reason without abnormal affect or behaviors.  RELEVANT LABS AND IMAGING: CBC    Component Value Date/Time   WBC 5.7 05/18/2024 1927   RBC 4.92 05/18/2024 1927   HGB 15.2 05/18/2024 1927   HCT 44.8 05/18/2024 1927   PLT 192 05/18/2024 1927   MCV 91.1 05/18/2024 1927   MCH 30.9 05/18/2024 1927   MCHC 33.9  05/18/2024 1927   RDW 12.9 05/18/2024 1927   LYMPHSABS 2.0 05/18/2024 1927   MONOABS 0.5 05/18/2024 1927  EOSABS 0.1 05/18/2024 1927   BASOSABS 0.0 05/18/2024 1927    CMP     Component Value Date/Time   NA 135 07/18/2024 1523   K 4.3 07/18/2024 1523   CL 93 (L) 07/18/2024 1523   CO2 26 07/18/2024 1523   GLUCOSE 336 (H) 07/18/2024 1523   GLUCOSE 509 (A) 07/18/2024 1333   BUN 6 07/18/2024 1523   CREATININE 0.49 (L) 07/18/2024 1523   CREATININE 0.94 09/27/2023 1523   CALCIUM  9.1 07/18/2024 1523   PROT 5.7 (L) 08/09/2024 1501   ALBUMIN 3.9 (L) 08/09/2024 1501   AST 69 (H) 08/09/2024 1501   ALT 148 (H) 08/09/2024 1501   ALKPHOS 137 (H) 08/09/2024 1501   BILITOT 0.9 08/09/2024 1501   GFRNONAA >60 05/18/2024 1927   GFRAA >60 02/16/2020 1638    Assessment: 1.  Chronic alcoholic pancreatitis: With pseudocyst, last temperature showed stability and repeat CT recommended in a year 2.  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  Elevated LFTs: Elevated for at least the past 5 months, consider in the setting of alcohol   Plan: 1. ***     Karl Failing, PA-C [facility] 08/15/2024, 10:43 AM  Cc: Paseda, Folashade R, FNP  "

## 2024-08-16 ENCOUNTER — Ambulatory Visit: Payer: Self-pay | Admitting: Physician Assistant

## 2024-08-22 ENCOUNTER — Telehealth: Payer: Self-pay

## 2024-08-22 ENCOUNTER — Ambulatory Visit: Payer: Self-pay

## 2024-08-22 NOTE — Progress Notes (Deleted)
 "  08/22/2024 Name: Karl Torres MRN: [id number] DOB: 11/25/1975  No chief complaint on file.   Karl Torres is a 48 y.o. year old male who was referred for medication management by their primary care provider, Paseda, Folashade R, FNP. They presented for a face to face visit today.   They were referred to the pharmacist by their PCP for assistance in managing diabetes and medication access . PMH includes HTN, alcohol induced chronic pancreatitis, pancreatic insufficiency, T2DM, housing insecurity, food insecurity. Hospitalized at [facility] from 04/29/24 to 05/01/24 for stercoral proctitis. Throughout admission he had difficult to control blood sugars, in the setting of chronic pancreatitis. His pancreatic elastase was < 1, indicating several pancreatitic insufficienc    Subjective: Patient was last seen by PCP, Lorice Shall, P, on 08/09/24. At last visit, set up $5 [facility]. Patient reported he would try to pick up. He reported he had 2 pens of Humulin 70/30 left and he was instructed to continue taking 14 units daily, as other insulin  options were not affordable. We attempted to cancel his insurance plan since he is unsure where it came from, but we were unable to contact them after trying 3 different numbers. Patient was instructed to apply for Medicaid at the [facility] office.  Today, patient presents in *** good spirits and presents without *** any assistance. ***Patient is accompanied by ***.   Use card to get 2 boxes free   Care Team: Primary Care Provider: Paseda, Folashade R, FNP ; Next Scheduled Visit: *** {careteamprovider:27366}  Medication Access/Adherence  Current Pharmacy:  [id number] [address], [place] - [address] [facility] [address] [place] [address] Phone: [phone] Fax: [phone]   Patient reports affordability concerns with their medications: {YES/NO:21197} Patient reports access/transportation concerns  to their pharmacy: {YES/NO:21197} Patient reports adherence concerns with their medications:  {YES/NO:21197} ***  *** Patient denies adherence with medications, reports missing *** medications *** times per week, on average.  Pancreatic insufficiency:   Current medications: Creon  24000-76000 2 capsules TID with meals and 1 capsule with snacks   Current medication access support: enrolled in manufacturer $5 coupon card  Diabetes:  Current medications: Lantus  10 units daily (has not been able to pick up due to cost, taking Humulin 70/30 14 units daily), metformin  XR 500 mg BID with meals Medications tried in the past: ***  Current glucose readings: ***  Patient {Actions; denies-reports:120008} hypoglycemic s/sx including ***dizziness, shakiness, sweating. Patient {Actions; denies-reports:120008} hyperglycemic symptoms including ***polyuria, polydipsia, polyphagia, nocturia, neuropathy, blurred vision.  Current meal patterns:  - Breakfast: *** - Lunch *** - Supper *** - Snacks *** - Drinks ***  Current physical activity: ***  Current medication access support: ***  Macrovascular and Microvascular Risk Reduction:  Statin? yes (atorvastatin  10 mg daily); ACEi/ARB? no; therapy not indicated  Last urinary albumin/creatinine ratio:  Lab Results  Component Value Date   MICRALBCREAT 26 09/03/2023   Last eye exam:   Last foot exam: 09/03/2023 Tobacco Use:  Tobacco Use: High Risk (08/09/2024)   Patient History    Smoking Tobacco Use: Every Day    Smokeless Tobacco Use: Never    Passive Exposure: Not on file     Objective:  BP Readings from Last 3 Encounters:  08/09/24 105/83  07/18/24 106/77  07/11/24 118/80    Lab Results  Component Value Date   HGBA1C >15.5 07/06/2024   HGBA1C >15.5 (H) 03/16/2024   HGBA1C >15.5 (H)  01/31/2024       Latest Ref Rng & Units 07/18/2024    3:23 PM 07/18/2024    1:33 PM 05/18/2024    7:27 PM  BMP  Glucose 70 - 99 mg/dL 663  490   503   BUN 6 - 24 mg/dL 6   8   Creatinine 9.23 - 1.27 mg/dL 9.50   9.26   BUN/Creat Ratio 9 - 20 12     Sodium 134 - 144 mmol/L 135   131   Potassium 3.5 - 5.2 mmol/L 4.3   4.2   Chloride 96 - 106 mmol/L 93   97   CO2 20 - 29 mmol/L 26   25   Calcium  8.7 - 10.2 mg/dL 9.1   8.2     Lab Results  Component Value Date   CHOL 112 03/17/2024   HDL 49 03/17/2024   LDLCALC 47 03/17/2024   LDLDIRECT 58 07/18/2024   TRIG 79 03/17/2024   CHOLHDL 2.3 03/17/2024    Medications Reviewed Today   Medications were not reviewed in this encounter       Assessment/Plan:   Diabetes: - Currently {CHL Controlled/Uncontrolled:[phone]} with most recent A1C of *** {Desc; above/below:16086} goal {CCM A1c HNJOD:[id number]}. Medication adherence appears ***. Control is suboptimal due to ***.   - Last UACR *** - Patient denies personal or family history of multiple endocrine neoplasia type 2, medullary thyroid  cancer; personal history of pancreatitis or gallbladder disease.*** - Reviewed long term cardiovascular and renal outcomes of uncontrolled blood sugar - Reviewed goal A1c, goal fasting, and goal 2 hour post prandial glucose - Reviewed hypoglycemia management plan and the rule of 15*** - Reviewed dietary modifications including *** utilizing the healthy plate method, limiting portion size of carbohydrate foods, increasing intake of protein and non-starchy vegetables. Counseled patient to stay hydrated with water throughout the day. - Reviewed lifestyle modifications including: aiming for 150 minutes of moderate intensity exercise every week. *** - Recommend to ***  - Recommend to check glucose twice daily: fasting and 2-hr PPG ***. Counseled patient to bring glucometer or BG log to every appointment. - Next A1C due ***    Meets financial criteria for *** patient assistance program through ***. Will collaborate with provider, CPhT, and patient to pursue assistance.    Medication Management: -  Currently strategy {sufficient/insufficient:26424} to maintain appropriate adherence to prescribed medication regimen - Suggested use of weekly pill box to organize medications - Created list of medication, indication, and administration time. Provided to patient - Discussed collaboration with local pharmacies for adherence packaging. Reviewed local pharmacies with adherence packaging options. Patient elects to ***      Written patient instructions provided. Patient verbalized understanding of treatment plan.   Follow Up Plan: *** Pharmacist *** PCP clinic visit in *** Patient seen with ***  ***  "

## 2024-08-22 NOTE — Telephone Encounter (Signed)
 Attempted to contact patient for scheduled in-person appointment for medication management. Left HIPAA compliant message for patient to return my call at their convenience.   [facility] inquire whether [facility] Creon  ($5/30ds). It was prepared to be dispensed on 08/09/24, but was never picked up and has now been returned to stock.   In anticipation of appt today, I had downloaded Novo Nordisk one-time free insulin  supply card. Info below must be used within 30 days or a new card will need to be downloaded. If patient is able to be reached in that time frame, can help facilitate filling insulin . May be beneficial to fill his medications at a [facility] given access concerns and potential to use AR account.     Lorain Baseman, PharmD [facility] [phone]

## 2024-08-23 ENCOUNTER — Emergency Department (HOSPITAL_COMMUNITY)
Admission: EM | Admit: 2024-08-23 | Discharge: 2024-08-23 | Disposition: A | Discharge status: Home / Self Care | Payer: Self-pay | Attending: Emergency Medicine | Admitting: Emergency Medicine

## 2024-08-23 ENCOUNTER — Encounter (HOSPITAL_COMMUNITY): Payer: Self-pay

## 2024-08-23 ENCOUNTER — Other Ambulatory Visit: Payer: Self-pay

## 2024-08-23 ENCOUNTER — Emergency Department (HOSPITAL_COMMUNITY): Payer: Self-pay

## 2024-08-23 ENCOUNTER — Ambulatory Visit: Payer: Self-pay

## 2024-08-23 ENCOUNTER — Telehealth: Payer: Self-pay | Admitting: Pharmacy Technician

## 2024-08-23 DIAGNOSIS — Z79899 Other long term (current) drug therapy: Secondary | ICD-10-CM | POA: Insufficient documentation

## 2024-08-23 DIAGNOSIS — Z7982 Long term (current) use of aspirin: Secondary | ICD-10-CM | POA: Insufficient documentation

## 2024-08-23 DIAGNOSIS — Z7984 Long term (current) use of oral hypoglycemic drugs: Secondary | ICD-10-CM | POA: Insufficient documentation

## 2024-08-23 DIAGNOSIS — R739 Hyperglycemia, unspecified: Secondary | ICD-10-CM | POA: Insufficient documentation

## 2024-08-23 DIAGNOSIS — Z794 Long term (current) use of insulin: Secondary | ICD-10-CM | POA: Insufficient documentation

## 2024-08-23 LAB — CBC WITH DIFFERENTIAL/PLATELET
Abs Immature Granulocytes: 0.05 K/uL (ref 0.00–0.07)
Basophils Absolute: 0 K/uL (ref 0.0–0.1)
Basophils Relative: 0 %
Eosinophils Absolute: 0 K/uL (ref 0.0–0.5)
Eosinophils Relative: 1 %
HCT: 43.2 % (ref 39.0–52.0)
Hemoglobin: 15.3 g/dL (ref 13.0–17.0)
Immature Granulocytes: 1 %
Lymphocytes Relative: 30 %
Lymphs Abs: 2.4 K/uL (ref 0.7–4.0)
MCH: 31 pg (ref 26.0–34.0)
MCHC: 35.4 g/dL (ref 30.0–36.0)
MCV: 87.4 fL (ref 80.0–100.0)
Monocytes Absolute: 0.5 K/uL (ref 0.1–1.0)
Monocytes Relative: 6 %
Neutro Abs: 5 K/uL (ref 1.7–7.7)
Neutrophils Relative %: 62 %
Platelets: 233 K/uL (ref 150–400)
RBC: 4.94 MIL/uL (ref 4.22–5.81)
RDW: 13.1 % (ref 11.5–15.5)
WBC: 8 K/uL (ref 4.0–10.5)
nRBC: 0 % (ref 0.0–0.2)

## 2024-08-23 LAB — COMPREHENSIVE METABOLIC PANEL WITH GFR
ALT: 199 U/L — ABNORMAL HIGH (ref 0–44)
AST: 51 U/L — ABNORMAL HIGH (ref 15–41)
Albumin: 3.7 g/dL (ref 3.5–5.0)
Alkaline Phosphatase: 123 U/L (ref 38–126)
Anion gap: 9 (ref 5–15)
BUN: 21 mg/dL — ABNORMAL HIGH (ref 6–20)
CO2: 27 mmol/L (ref 22–32)
Calcium: 8.6 mg/dL — ABNORMAL LOW (ref 8.9–10.3)
Chloride: 95 mmol/L — ABNORMAL LOW (ref 98–111)
Creatinine, Ser: 0.93 mg/dL (ref 0.61–1.24)
GFR, Estimated: >60 mL/min
Glucose, Bld: 443 mg/dL — ABNORMAL HIGH (ref 70–99)
Potassium: 4.5 mmol/L (ref 3.5–5.1)
Sodium: 131 mmol/L — ABNORMAL LOW (ref 135–145)
Total Bilirubin: 0.7 mg/dL (ref 0.0–1.2)
Total Protein: 5.5 g/dL — ABNORMAL LOW (ref 6.5–8.1)

## 2024-08-23 LAB — URINALYSIS, ROUTINE W REFLEX MICROSCOPIC
Bacteria, UA: NONE SEEN
Bilirubin Urine: NEGATIVE
Glucose, UA: >=500 mg/dL — AB
Hgb urine dipstick: NEGATIVE
Ketones, ur: NEGATIVE mg/dL
Leukocytes,Ua: NEGATIVE
Nitrite: NEGATIVE
Protein, ur: 100 mg/dL — AB
Specific Gravity, Urine: 1.026 (ref 1.005–1.030)
pH: 5 (ref 5.0–8.0)

## 2024-08-23 LAB — CK: Total CK: 61 U/L (ref 49–397)

## 2024-08-23 LAB — CBG MONITORING, ED
Glucose-Capillary: 420 mg/dL — ABNORMAL HIGH (ref 70–99)
Glucose-Capillary: 361 mg/dL — ABNORMAL HIGH (ref 70–99)

## 2024-08-23 MED ORDER — INSULIN ASPART 100 UNIT/ML IJ SOLN
6.0000 [IU] | Freq: Once | INTRAMUSCULAR | Status: AC
  Administered 2024-08-23: 6 [IU] via SUBCUTANEOUS
  Filled 2024-08-23: qty 6

## 2024-08-23 MED ORDER — SODIUM CHLORIDE 0.9 % IV BOLUS
1000.0000 mL | Freq: Once | INTRAVENOUS | Status: AC
  Administered 2024-08-23: 1000 mL via INTRAVENOUS

## 2024-08-23 NOTE — ED Notes (Signed)
 PT appears to be in no distress. He was given 4 mini bottles of water and told to drink them. Will continue to monitor his intake and recheck pressure.

## 2024-08-23 NOTE — Discharge Instructions (Signed)
Return for any problem.  ? ?Drink plenty of fluids. ?

## 2024-08-23 NOTE — ED Notes (Signed)
 Pt brought back to triage for labs. Pt is AxOx4.

## 2024-08-23 NOTE — ED Triage Notes (Signed)
 Patient BIB [facility] after calling for high BP and CBG. Patient's BP actually low 90/60, CBG 175. Patient takes BP meds on and off, did not take insulin  this morning. Also complaining of feet/leg pain b/c he sleeps outside.  BP 90/60 HR 90 RR 16 98% RA Cbg 175

## 2024-08-23 NOTE — Progress Notes (Signed)
" ° °  08/23/2024  Patient ID: Karl Torres, male   DOB: [birth year], 48 y.o.   MRN: [id number]  Patient engaged with clinical pharmacist for management of medication access on 08/09/2024. Outreach by [employer] technician was requested.   Outreached patient to discuss medication access medication management. Left voicemail for patient to return my call at their convenience.    Debera Sterba, CPhT [facility] Office: [phone] Email: [email or web address]  "

## 2024-08-23 NOTE — ED Provider Triage Note (Signed)
 Emergency Medicine Provider Triage Evaluation Note  Karl Torres , a 48 y.o. male  was evaluated in triage.  Pt complains of pain in hands and feet and multiple other complaints. Reports he sleeps outside in a shed and it was very cold last night and developed pain hands and feet this morning. States shins feel numb bilaterally and legs feel weak. States he had felt dizzy this morning. Has had recent cough with some shortness of breath, denies fever. Reports frequent urination. Thought his BP might be high but when EMS arrived his BP was low.  Has history of diabetes.    Review of Systems  Positive: As above Negative: As above  Physical Exam  BP 97/72 (BP Location: Left Arm)   Pulse 98   Temp 97.6 F (36.4 C) (Oral)   Resp 18   Ht 5' 8 (1.727 m)   Wt 62.1 kg   SpO2 100%   BMI 20.83 kg/m  Gen:   Awake, no distress   Resp:  Normal effort, CTAP MSK:   Moves extremities without difficulty  Other:  Subjectively decreased sensation along calves bilaterally, strength intact in bilateral LE  Medical Decision Making  Medically screening exam initiated at 2:08 PM.  Appropriate orders placed.  Karl Torres was informed that the remainder of the evaluation will be completed by another provider, this initial triage assessment does not replace that evaluation, and the importance of remaining in the ED until their evaluation is complete.    Karl Torres, Karl Nutter K, DO 08/23/24 [phone]

## 2024-08-23 NOTE — ED Provider Notes (Signed)
 " [place] EMERGENCY DEPARTMENT AT [facility] Provider Note   CSN: [id number] Arrival date & time: 08/23/24  1257     Patient presents with: Hypotension   Karl Torres is a 48 y.o. male.   48 year old male with prior medical history as detailed below presents for evaluation.  Patient with multiple nonspecific complaints.  Patient reports that he slept outside in the cold last night.  Subsequently he felt like his hands and feet were painful.  Patient does not appear to be in distress.  He denies fever.  He is requesting something to eat and drink at time of my evaluation.  The history is provided by the patient and medical records.       Prior to Admission medications  Medication Sig Start Date End Date Taking? Authorizing Provider  acetaminophen  (TYLENOL ) 500 MG tablet Take 2 tablets (1,000 mg total) by mouth every 6 (six) hours as needed for headache, moderate pain (pain score 4-6) or mild pain (pain score 1-3). 05/01/24   Waymond Cart, MD  aspirin  EC 81 MG tablet Take 1 tablet (81 mg total) by mouth daily. Swallow whole. Patient not taking: Reported on 08/09/2024 09/17/23   [name], [name], NP  atorvastatin  (LIPITOR) 10 MG tablet Take 1 tablet (10 mg total) by mouth daily. Patient not taking: Reported on 08/09/2024 07/18/24   Paseda, Folashade R, FNP  Blood Glucose Monitoring Suppl DEVI 1 each by Does not apply route as directed. Dispense based on patient and insurance preference. Use up to four times daily as directed. (FOR ICD-10 E10.9, E11.9). Patient not taking: Reported on 08/09/2024 07/18/24   Paseda, Folashade R, FNP  fluticasone  (FLONASE ) 50 MCG/ACT nasal spray Place 2 sprays into both nostrils daily. Patient not taking: Reported on 08/09/2024 09/17/23   [name], [name], NP  folic acid  (FOLVITE ) 1 MG tablet Take 1 tablet (1 mg total) by mouth daily. 07/18/24   Paseda, Folashade R, FNP  gabapentin  (NEURONTIN ) 300 MG capsule Take 1 capsule (300 mg total) by mouth  at bedtime. Patient not taking: Reported on 08/09/2024 07/18/24   Paseda, Folashade R, FNP  Glucose Blood (BLOOD GLUCOSE TEST STRIPS) STRP 1 each by In Vitro route in the morning, at noon, and at bedtime. May substitute to any manufacturer covered by patient's insurance. Patient not taking: Reported on 08/09/2024 09/17/23   [name], [name], NP  Glucose Blood (BLOOD GLUCOSE TEST STRIPS) STRP 1 each by Does not apply route as directed. Dispense based on patient and insurance preference. Use up to four times daily as directed. (FOR ICD-10 E10.9, E11.9). Patient not taking: Reported on 08/09/2024 07/18/24   Paseda, Folashade R, FNP  insulin  glargine (LANTUS  SOLOSTAR) 100 UNIT/ML Solostar Pen Inject 10 Units into the skin daily. 07/18/24   Paseda, Folashade R, FNP  Insulin  Pen Needle 32G X 4 MM MISC 1 Needle by Does not apply route daily. 07/18/24   Paseda, Folashade R, FNP  Lancet Device MISC 1 each by Does not apply route as directed. Dispense based on patient and insurance preference. Use up to four times daily as directed. (FOR ICD-10 E10.9, E11.9). Patient not taking: Reported on 08/09/2024 07/18/24   Paseda, Folashade R, FNP  Lancets MISC 1 each by Does not apply route as directed. Dispense based on patient and insurance preference. Use up to four times daily as directed. (FOR ICD-10 E10.9, E11.9). Patient not taking: Reported on 08/09/2024 07/18/24   Paseda, Folashade R, FNP  meloxicam  (MOBIC ) 7.5 MG tablet Take  1 tablet (7.5 mg total) by mouth daily as needed for pain. Patient not taking: Reported on 08/09/2024 07/18/24   Paseda, Folashade R, FNP  metFORMIN  (GLUCOPHAGE -XR) 500 MG 24 hr tablet Take 1 tablet (500 mg total) by mouth 2 (two) times daily with a meal. 07/18/24   Paseda, Folashade R, FNP  Multiple Vitamin (MULTIVITAMIN WITH MINERALS) TABS tablet Take 1 tablet by mouth daily. 03/18/24   Gonfa, Taye T, MD  nicotine  (NICODERM CQ  - DOSED IN MG/24 HOURS) 14 mg/24hr patch Place 1 patch (14 mg  total) onto the skin daily. Patient not taking: Reported on 08/09/2024 03/18/24   Kathrin Mignon DASEN, MD  Pancrelipase , Lip-Prot-Amyl, (CREON ) 24000-76000 units CPEP Take 2 capsules three times per day with each meal. Take 1 capsule with snacks. Maximum 8 capsules per day. 07/18/24   Paseda, Folashade R, FNP  thiamine  (VITAMIN B1) 100 MG tablet Take 1 tablet (100 mg total) by mouth daily. Patient not taking: Reported on 08/09/2024 07/18/24   Paseda, Folashade R, FNP    Allergies: Patient has no known allergies.    Review of Systems  All other systems reviewed and are negative.   Updated Vital Signs BP 96/66 (BP Location: Left Arm)   Pulse 68   Temp 97.9 F (36.6 C) (Oral)   Resp 18   Ht 5' 8 (1.727 m)   Wt 62.1 kg   SpO2 100%   BMI 20.83 kg/m   Physical Exam Vitals and nursing note reviewed.  Constitutional:      General: He is not in acute distress.    Appearance: He is well-developed.  HENT:     Head: Normocephalic and atraumatic.  Eyes:     Conjunctiva/sclera: Conjunctivae normal.  Cardiovascular:     Rate and Rhythm: Normal rate and regular rhythm.     Heart sounds: No murmur heard. Pulmonary:     Effort: Pulmonary effort is normal. No respiratory distress.     Breath sounds: Normal breath sounds.  Abdominal:     Palpations: Abdomen is soft.     Tenderness: There is no abdominal tenderness.  Musculoskeletal:        General: No swelling.     Cervical back: Neck supple.  Skin:    General: Skin is warm and dry.     Capillary Refill: Capillary refill takes less than 2 seconds.  Neurological:     Mental Status: He is alert.  Psychiatric:        Mood and Affect: Mood normal.     (all labs ordered are listed, but only abnormal results are displayed) Labs Reviewed  COMPREHENSIVE METABOLIC PANEL WITH GFR - Abnormal; Notable for the following components:      Result Value   Sodium 131 (*)    Chloride 95 (*)    Glucose, Bld 443 (*)    BUN 21 (*)    Calcium  8.6 (*)     Total Protein 5.5 (*)    AST 51 (*)    ALT 199 (*)    All other components within normal limits  CBC WITH DIFFERENTIAL/PLATELET  CK  URINALYSIS, ROUTINE W REFLEX MICROSCOPIC  CBG MONITORING, ED    EKG: EKG Interpretation Date/Time:  Wednesday August 23 2024 14:35:44 EST Ventricular Rate:  84 PR Interval:  128 QRS Duration:  84 QT Interval:  368 QTC Calculation: 434 R Axis:   74  Text Interpretation: Normal sinus rhythm Septal infarct , age undetermined Abnormal ECG No significant change since last tracing Confirmed by  Ellouise Fine [id number]) on 08/23/2024 3:07:49 PM  Radiology: DG Chest 2 View Result Date: 08/23/2024 EXAM: 2 VIEW(S) XRAY OF THE CHEST 08/23/2024 02:30:00 PM COMPARISON: 05/19/2024 CLINICAL HISTORY: Cough FINDINGS: LUNGS AND PLEURA: Nodular densities overlying lower lungs consistent with nipple shadows. No pleural effusion. No pneumothorax. HEART AND MEDIASTINUM: No acute abnormality of the cardiac and mediastinal silhouettes. BONES AND SOFT TISSUES: Thoracic degenerative changes. No acute osseous abnormality. IMPRESSION: 1. No acute findings. Electronically signed by: Waddell Calk MD 08/23/2024 02:57 PM EST RP Workstation: [id number]     Procedures   Medications Ordered in the ED  sodium chloride  0.9 % bolus 1,000 mL (has no administration in time range)                                    Medical Decision Making Presents with multiple nonspecific complaints.    Notable abnormalities on workup include elevated glucose.  This was improved with IV fluids and insulin .  Patient without indication for admission.    Close outpatient follow-up is stressed.  Strict return precautions given and understood.  Risk Prescription drug management.        Final diagnoses:  Hyperglycemia    ED Discharge Orders     None          Laurice Maude BROCKS, MD 08/23/24 2300  "

## 2024-08-29 ENCOUNTER — Telehealth: Payer: Self-pay | Admitting: Pharmacy Technician

## 2024-08-29 NOTE — Progress Notes (Signed)
" ° °  08/29/2024  Patient ID: Karl Torres, male   DOB: 1975/10/11, 49 y.o.   MRN: [id number]  Patient engaged with clinical pharmacist for management of medication access on 08/09/2024. Outreach by [employer] technician was requested.   Outreached patient to discuss medication access medication management. Left voicemail for patient to return my call at their convenience.    Georgie Eduardo, CPhT [facility] Office: [phone] Email: [email or web address]  "

## 2024-08-31 ENCOUNTER — Telehealth: Payer: Self-pay | Admitting: Pharmacy Technician

## 2024-08-31 NOTE — Progress Notes (Signed)
" ° °  08/31/2024  Patient ID: Karl Torres, male   DOB: 28-Jan-1976, 49 y.o.   MRN: [id number]  Patient engaged with clinical pharmacist for management of medication access on 08/09/2024. Outreach by [employer] technician was requested.   Outreached patient to discuss medication access medication management. Left voicemail for patient to return my call at their convenience.   Kourtnie Sachs, CPhT [facility] Office: [phone] Email: [email or web address]    "

## 2024-09-14 ENCOUNTER — Ambulatory Visit: Payer: Self-pay | Admitting: Physician Assistant

## 2024-09-20 ENCOUNTER — Ambulatory Visit: Payer: Self-pay | Admitting: Nurse Practitioner

## 2024-09-20 ENCOUNTER — Encounter: Payer: Self-pay | Admitting: Nurse Practitioner

## 2024-09-20 ENCOUNTER — Other Ambulatory Visit (HOSPITAL_COMMUNITY): Payer: Self-pay

## 2024-09-20 VITALS — BP 108/81 | HR 78 | Wt 140.0 lb

## 2024-09-20 DIAGNOSIS — K8689 Other specified diseases of pancreas: Secondary | ICD-10-CM | POA: Diagnosis not present

## 2024-09-20 DIAGNOSIS — G8929 Other chronic pain: Secondary | ICD-10-CM

## 2024-09-20 DIAGNOSIS — Z794 Long term (current) use of insulin: Secondary | ICD-10-CM | POA: Diagnosis not present

## 2024-09-20 DIAGNOSIS — M25562 Pain in left knee: Secondary | ICD-10-CM | POA: Diagnosis not present

## 2024-09-20 DIAGNOSIS — M25561 Pain in right knee: Secondary | ICD-10-CM

## 2024-09-20 DIAGNOSIS — Z5941 Food insecurity: Secondary | ICD-10-CM

## 2024-09-20 DIAGNOSIS — K529 Noninfective gastroenteritis and colitis, unspecified: Secondary | ICD-10-CM

## 2024-09-20 DIAGNOSIS — E785 Hyperlipidemia, unspecified: Secondary | ICD-10-CM

## 2024-09-20 DIAGNOSIS — R748 Abnormal levels of other serum enzymes: Secondary | ICD-10-CM | POA: Diagnosis not present

## 2024-09-20 DIAGNOSIS — Z59819 Housing instability, housed unspecified: Secondary | ICD-10-CM | POA: Diagnosis not present

## 2024-09-20 DIAGNOSIS — M51361 Other intervertebral disc degeneration, lumbar region with lower extremity pain only: Secondary | ICD-10-CM | POA: Diagnosis not present

## 2024-09-20 DIAGNOSIS — E1165 Type 2 diabetes mellitus with hyperglycemia: Secondary | ICD-10-CM

## 2024-09-20 LAB — GLUCOSE, POCT (MANUAL RESULT ENTRY): POC Glucose: 505 mg/dL — AB (ref 70–99)

## 2024-09-20 MED ORDER — INSULIN NPH (HUMAN) (ISOPHANE) 100 UNIT/ML ~~LOC~~ SUSP
20.0000 [IU] | Freq: Once | SUBCUTANEOUS | Status: AC
  Administered 2024-09-20: 20 [IU] via SUBCUTANEOUS

## 2024-09-20 NOTE — Assessment & Plan Note (Signed)
 Find help resources provided Living in a shed provided by his family member

## 2024-09-20 NOTE — Progress Notes (Signed)
 "  Established Patient Office Visit  Subjective:  Patient ID: Karl Torres, male    DOB: July 29, 1976  Age: 49 y.o. MRN: [id number]  CC:  Chief Complaint  Patient presents with   Diabetes    HPI  Discussed the use of AI scribe software for clinical note transcription with the patient, who gave verbal consent to proceed.  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  who presents for follow-up on his chronic medical conditions.  He has been inconsistently adhering to his medication regimen due to forgetfulness and stress. He is taking metformin  500mg   four to five times a week instead of the prescribed twice daily . He has not started Lantus  insulin  due to affordability issues and is using an old insulin  humilin 70/30 mix at 16 units once daily, three times a week. His blood sugar levels have been high, with a recent reading of 500.  He experiences chronic diarrhea and gas. A previous stool culture showed no infection. He has missed two gastroenterology appointments and is awaiting rescheduling.  He reports chronic pain in his lower back, joints, and knees, with pain exacerbated when lying on his side. He was previously prescribed meloxicam  and gabapentin  for pain management but has not been able to pick up meloxicam  due to financial constraints. Gabapentin  provides some relief.  He is currently living in a shed provided by a family member and has no working phone or job, which complicates communication and access to care. He has recently been approved for Medicaid but has not yet received the card, which affects his ability to obtain medications.  No alcohol use despite elevated liver enzymes.    Assessment & Plan         Past Medical History:  Diagnosis Date   Diabetes mellitus  without complication (HCC)    Hypertension    Obstipation 04/30/2024   Pancreatic cyst 04/30/2024   CT 04/29/24 Pancreas: Diffusely atrophied with coarse calcifications, without  significant change. The previously demonstrated 3.0 cm pancreatic  tail cyst currently measures 3 cm in maximum diameter on image  number 23/3.     Stercoral proctitis 04/29/2024    History reviewed. No pertinent surgical history.  Family History  Problem Relation Age of Onset   Clotting disorder Mother    Hypertension Mother    Lung cancer Mother    Hypertension Father    Cancer Father    Hypertension Sister    Diabetes Sister    Hypertension Sister    Mental illness Maternal Uncle    Clotting disorder Maternal Grandmother    Diabetes Paternal Grandmother     Social History   Socioeconomic History   Marital status: Single    Spouse name: Not on file   Number of children: Not on file   Years of education: Not on file   Highest education level: Not on file  Occupational History   Not on file  Tobacco Use   Smoking status: Every Day    Current packs/day: 0.50    Types: Cigarettes   Smokeless tobacco: Never  Vaping Use   Vaping status: Never Used  Substance and Sexual Activity   Alcohol use: Not Currently   Drug use: Never   Sexual activity: Not Currently  Other Topics Concern   Not on file  Social History Narrative   Homeless,  living in cousin's shelter   Social Drivers of Health   Tobacco Use: High Risk (09/20/2024)   Patient History    Smoking Tobacco Use: Every Day    Smokeless Tobacco Use: Never    Passive Exposure: Not on file  Financial Resource Strain: Not on file  Food Insecurity: Food Insecurity Present (08/09/2024)   Epic    Worried About Programme Researcher, Broadcasting/film/video in the Last Year: Often true    Barista in the Last Year: Often true  Transportation Needs: Unmet Transportation Needs (08/09/2024)   Epic    Lack of Transportation (Medical): Yes    Lack of Transportation  (Non-Medical): Yes  Physical Activity: Not on file  Stress: Not on file  Social Connections: Not on file  Intimate Partner Violence: Not At Risk (08/09/2024)   Epic    Fear of Current or Ex-Partner: No    Emotionally Abused: No    Physically Abused: No    Sexually Abused: No  Depression (PHQ2-9): High Risk (09/20/2024)   Depression (PHQ2-9)    PHQ-2 Score: 21  Alcohol Screen: Not on file  Housing: High Risk (08/09/2024)   Epic    Unable to Pay for Housing in the Last Year: Yes    Number of Times Moved in the Last Year: 6    Homeless in the Last Year: Yes  Utilities: Patient Declined (08/09/2024)   Epic    Threatened with loss of utilities: Patient declined  Health Literacy: Not on file    Outpatient Medications Prior to Visit  Medication Sig Dispense Refill   acetaminophen  (TYLENOL ) 500 MG tablet Take 2 tablets (1,000 mg total) by mouth every 6 (six) hours as needed for headache, moderate pain (pain score 4-6) or mild pain (pain score 1-3). 30 tablet 0   folic acid  (FOLVITE ) 1 MG tablet Take 1 tablet (1 mg total) by mouth daily. 90 tablet 3   Insulin  Pen Needle 32G X 4 MM MISC 1 Needle by Does not apply route daily. 100 each 2   metFORMIN  (GLUCOPHAGE -XR) 500 MG 24 hr tablet Take 1 tablet (500 mg total) by mouth 2 (two) times daily with a meal. 180 tablet 1   Pancrelipase , Lip-Prot-Amyl, (CREON ) 24000-76000 units CPEP Take 2 capsules three times per day with each meal. Take 1 capsule with snacks. Maximum 8 capsules per day. 200 capsule 3   aspirin  EC 81 MG tablet Take 1 tablet (81 mg total) by mouth daily. Swallow whole. (Patient not taking: Reported on 09/20/2024) 30 tablet 5   Blood Glucose Monitoring Suppl DEVI 1 each by Does not apply route as directed. Dispense based on patient and insurance preference. Use up to four times daily as directed. (FOR ICD-10 E10.9, E11.9). (Patient not taking: Reported on 09/20/2024) 1 each 0   fluticasone  (FLONASE ) 50 MCG/ACT nasal spray Place 2  sprays into both nostrils daily. (Patient not taking: Reported on 09/20/2024) 16 g 6   gabapentin  (NEURONTIN ) 300 MG capsule Take 1 capsule (300 mg total) by mouth at bedtime. (Patient not taking: Reported on 09/20/2024) 90 capsule 1   Glucose Blood (BLOOD GLUCOSE TEST STRIPS) STRP 1 each by In Vitro route in the morning, at noon, and at bedtime. May substitute to any manufacturer covered by patient's insurance. (Patient not taking: Reported on 09/20/2024) 100 strip 4   Glucose Blood (BLOOD GLUCOSE TEST STRIPS) STRP 1 each by Does not apply route as directed. Dispense based on patient and insurance preference. Use up to four  times daily as directed. (FOR ICD-10 E10.9, E11.9). (Patient not taking: Reported on 09/20/2024) 100 strip 0   insulin  glargine (LANTUS  SOLOSTAR) 100 UNIT/ML Solostar Pen Inject 10 Units into the skin daily. (Patient not taking: Reported on 09/20/2024) 15 mL 3   Lancet Device MISC 1 each by Does not apply route as directed. Dispense based on patient and insurance preference. Use up to four times daily as directed. (FOR ICD-10 E10.9, E11.9). (Patient not taking: Reported on 09/20/2024) 1 each 0   Lancets MISC 1 each by Does not apply route as directed. Dispense based on patient and insurance preference. Use up to four times daily as directed. (FOR ICD-10 E10.9, E11.9). (Patient not taking: Reported on 09/20/2024) 100 each 0   meloxicam  (MOBIC ) 7.5 MG tablet Take 1 tablet (7.5 mg total) by mouth daily as needed for pain. (Patient not taking: Reported on 09/20/2024) 30 tablet 1   Multiple Vitamin (MULTIVITAMIN WITH MINERALS) TABS tablet Take 1 tablet by mouth daily. (Patient not taking: Reported on 09/20/2024)     nicotine  (NICODERM CQ  - DOSED IN MG/24 HOURS) 14 mg/24hr patch Place 1 patch (14 mg total) onto the skin daily. (Patient not taking: Reported on 09/20/2024)     thiamine  (VITAMIN B1) 100 MG tablet Take 1 tablet (100 mg total) by mouth daily. (Patient not taking: Reported on 09/20/2024) 90  tablet 0   atorvastatin  (LIPITOR) 10 MG tablet Take 1 tablet (10 mg total) by mouth daily. (Patient not taking: Reported on 09/20/2024) 60 tablet 1   No facility-administered medications prior to visit.    Allergies[1]  ROS Review of Systems  Constitutional:  Negative for appetite change, chills, fatigue and fever.  HENT:  Negative for congestion, postnasal drip, rhinorrhea and sneezing.   Respiratory:  Negative for cough, shortness of breath and wheezing.   Cardiovascular:  Negative for chest pain, palpitations and leg swelling.  Gastrointestinal:  Negative for abdominal pain, constipation, nausea and vomiting.  Genitourinary:  Negative for difficulty urinating, dysuria, flank pain and frequency.  Musculoskeletal:  Positive for arthralgias and back pain. Negative for joint swelling and myalgias.  Skin:  Negative for color change, pallor and rash.  Neurological:  Negative for dizziness, facial asymmetry, weakness, numbness and headaches.  Psychiatric/Behavioral:  Negative for behavioral problems, confusion, self-injury and suicidal ideas.       Objective:    Physical Exam Vitals and nursing note reviewed.  Constitutional:      General: He is not in acute distress.    Appearance: Normal appearance. He is not ill-appearing, toxic-appearing or diaphoretic.  Eyes:     General: No scleral icterus.       Right eye: No discharge.        Left eye: No discharge.     Extraocular Movements: Extraocular movements intact.     Conjunctiva/sclera: Conjunctivae normal.  Cardiovascular:     Rate and Rhythm: Normal rate and regular rhythm.     Pulses: Normal pulses.     Heart sounds: Normal heart sounds. No murmur heard.    No friction rub. No gallop.  Pulmonary:     Effort: Pulmonary effort is normal. No respiratory distress.     Breath sounds: Normal breath sounds. No stridor. No wheezing, rhonchi or rales.  Chest:     Chest wall: No tenderness.  Abdominal:     General: There is no  distension.     Palpations: Abdomen is soft.     Tenderness: There is no right CVA tenderness, left  CVA tenderness or guarding.  Musculoskeletal:        General: No deformity or signs of injury.     Right lower leg: No edema.     Left lower leg: No edema.  Skin:    General: Skin is warm and dry.     Capillary Refill: Capillary refill takes less than 2 seconds.     Coloration: Skin is not jaundiced or pale.     Findings: No bruising, erythema or lesion.  Neurological:     Mental Status: He is alert and oriented to person, place, and time.     Motor: No weakness.     Gait: Gait normal.  Psychiatric:        Mood and Affect: Mood normal.        Behavior: Behavior normal.        Thought Content: Thought content normal.        Judgment: Judgment normal.     BP 108/81   Pulse 78   Wt 140 lb (63.5 kg)   SpO2 100%   BMI 21.29 kg/m  Wt Readings from Last 3 Encounters:  09/20/24 140 lb (63.5 kg)  08/23/24 137 lb (62.1 kg)  08/09/24 137 lb 9.6 oz (62.4 kg)    Lab Results  Component Value Date   TSH 1.060 07/18/2024   Lab Results  Component Value Date   WBC 8.0 08/23/2024   HGB 15.3 08/23/2024   HCT 43.2 08/23/2024   MCV 87.4 08/23/2024   PLT 233 08/23/2024   Lab Results  Component Value Date   NA 131 (L) 08/23/2024   K 4.5 08/23/2024   CO2 27 08/23/2024   GLUCOSE 443 (H) 08/23/2024   BUN 21 (H) 08/23/2024   CREATININE 0.93 08/23/2024   BILITOT 0.7 08/23/2024   ALKPHOS 123 08/23/2024   AST 51 (H) 08/23/2024   ALT 199 (H) 08/23/2024   PROT 5.5 (L) 08/23/2024   ALBUMIN 3.7 08/23/2024   CALCIUM  8.6 (L) 08/23/2024   ANIONGAP 9 08/23/2024   EGFR 126 07/18/2024   Lab Results  Component Value Date   CHOL 112 03/17/2024   Lab Results  Component Value Date   HDL 49 03/17/2024   Lab Results  Component Value Date   LDLCALC 47 03/17/2024   Lab Results  Component Value Date   TRIG 79 03/17/2024   Lab Results  Component Value Date   CHOLHDL 2.3 03/17/2024    Lab Results  Component Value Date   HGBA1C >15.5 07/06/2024      Assessment & Plan:   Problem List Items Addressed This Visit       Digestive   Chronic diarrhea    Persists despite normal stool culture results. Previous gastroenterology appointments were missed or canceled. Appointment rescheduled today       Pancreatic insufficiency   Advised to pick up Creon  and take as ordered Follow-up with GI        Endocrine   Uncontrolled type 2 diabetes mellitus with hyperglycemia, with long-term current use of insulin  (HCC) - Primary (Chronic)   Uncontrolled type 2 diabetes mellitus with hyperglycemia Blood sugar remains high due to inconsistent medication adherence. Risks of uncontrolled diabetes include kidney damage, stroke, and blindness. - Ensure consistent use of metformin  500 mg twice daily , continue Humulin 70/30 mix take 16 units twice daily for now Patient counseled on low-carb diet, CBG goals provided - Coordinated with clinical pharmacist to ensure medications are available at pharmacy. - Encouraged follow-up if there  are issues with medication access. One-time dose of Novolin 20 units given in the office today CBG 505.  prior to giving insulin .  398    , 30 minutes after insulin  given        Relevant Orders   Microalbumin/Creatinine Ratio, Urine   POCT glucose (manual entry) (Completed)     Musculoskeletal and Integument   Degenerative disc disease, lumbar   Take gabapentin  300 mg 3 times daily, meloxicam  7.5 mg daily as needed Follow-up with orthopedics      Relevant Orders   Ambulatory referral to Orthopedic Surgery     Other   Dyslipidemia, goal LDL below 70   Lab Results  Component Value Date   CHOL 112 03/17/2024   HDL 49 03/17/2024   LDLCALC 47 03/17/2024   LDLDIRECT 58 07/18/2024   TRIG 79 03/17/2024   CHOLHDL 2.3 03/17/2024  Currently not on medication, will hold off on starting atorvastatin  at this time due to elevated liver enzymes       Food insecurity   Food from the clinic pantry given Continued housing insecurity with living in a shed. No working phone for communication. Medicaid approval is pending, which may assist with medication access. - Coordinated with clinical pharmacist to ensure medications are available at pharmacy.       Housing insecurity   Find help resources provided Living in a shed provided by his family member      Chronic knee pain   Chronic knee pain likely related to degenerative changes.  Advised to pick up prescription for gabapentin  300 mg 3 times daily and meloxicam  7.5 mg daily as needed and take as ordered         Relevant Orders   Ambulatory referral to Orthopedic Surgery   Elevated liver enzymes   Lab Results  Component Value Date   ALT 199 (H) 08/23/2024   AST 51 (H) 08/23/2024   GGT 339 (H) 08/09/2024   ALKPHOS 123 08/23/2024   BILITOT 0.7 08/23/2024  Avoid alcohol Follow-up with GI       Meds ordered this encounter  Medications   insulin  NPH Human (NOVOLIN N) injection 20 Units    Follow-up: Return in about 2 weeks (around 10/04/2024).    Devorah Givhan R Hersel Mcmeen, FNP     [1] No Known Allergies  "

## 2024-09-20 NOTE — Assessment & Plan Note (Signed)
 Take gabapentin  300 mg 3 times daily, meloxicam  7.5 mg daily as needed Follow-up with orthopedics

## 2024-09-20 NOTE — Assessment & Plan Note (Signed)
 Lab Results  Component Value Date   CHOL 112 03/17/2024   HDL 49 03/17/2024   LDLCALC 47 03/17/2024   LDLDIRECT 58 07/18/2024   TRIG 79 03/17/2024   CHOLHDL 2.3 03/17/2024  Currently not on medication, will hold off on starting atorvastatin  at this time due to elevated liver enzymes

## 2024-09-20 NOTE — Assessment & Plan Note (Signed)
" °  Persists despite normal stool culture results. Previous gastroenterology appointments were missed or canceled. Appointment rescheduled today  "

## 2024-09-20 NOTE — Assessment & Plan Note (Signed)
 Advised to pick up Creon  and take as ordered Follow-up with GI

## 2024-09-20 NOTE — Assessment & Plan Note (Signed)
 Chronic knee pain likely related to degenerative changes.  Advised to pick up prescription for gabapentin  300 mg 3 times daily and meloxicam  7.5 mg daily as needed and take as ordered

## 2024-09-20 NOTE — Assessment & Plan Note (Signed)
 Lab Results  Component Value Date   ALT 199 (H) 08/23/2024   AST 51 (H) 08/23/2024   GGT 339 (H) 08/09/2024   ALKPHOS 123 08/23/2024   BILITOT 0.7 08/23/2024  Avoid alcohol Follow-up with GI

## 2024-09-20 NOTE — Assessment & Plan Note (Signed)
 Uncontrolled type 2 diabetes mellitus with hyperglycemia Blood sugar remains high due to inconsistent medication adherence. Risks of uncontrolled diabetes include kidney damage, stroke, and blindness. - Ensure consistent use of metformin  500 mg twice daily , continue Humulin 70/30 mix take 16 units twice daily for now Patient counseled on low-carb diet, CBG goals provided - Coordinated with clinical pharmacist to ensure medications are available at pharmacy. - Encouraged follow-up if there are issues with medication access. One-time dose of Novolin 20 units given in the office today CBG 505.  prior to giving insulin .  398    , 30 minutes after insulin  given

## 2024-09-20 NOTE — Patient Instructions (Addendum)
 For your uncontrolled diabetes please continue your current insulin  take 16 units twice a day continue metformin  500 mg twice daily.  It is very important that you take this medications as ordered.  Avoid sugar sweets soda  cut down on l bread rice pasta and other high carbohydrate food  I have referred you to orthopedics for your chronic pain Please call [phone]  in 1- 2 weeks to follow-up on your referral.  [address]  Take meloxicam  7.5 mg daily as needed for pain take medication with food  Goal for fasting blood sugar ranges from 80 to 120 and 2 hours after any meal or at bedtime should be between 130 to 170.     1. Elevated liver enzymes (Primary)  2. Chronic diarrhea  3. Degeneration of intervertebral disc of lumbar region with lower extremity pain - Ambulatory referral to Orthopedic Surgery  4. Uncontrolled type 2 diabetes mellitus with hyperglycemia, with long-term current use of insulin  (HCC) - Microalbumin/Creatinine Ratio, Urine  5. Chronic pain of both knees - Ambulatory referral to Orthopedic Surgery    It is important that you exercise regularly at least 30 minutes 5 times a week as tolerated  Think about what you will eat, plan ahead. Choose  clean, green, fresh or frozen over canned, processed or packaged foods which are more sugary, salty and fatty. 70 to 75% of food eaten should be vegetables and fruit. Three meals at set times with snacks allowed between meals, but they must be fruit or vegetables. Aim to eat over a 12 hour period , example 7 am to 7 pm, and STOP after  your last meal of the day. Drink water,generally about 64 ounces per day, no other drink is as healthy. Fruit juice is best enjoyed in a healthy way, by EATING the fruit.  Thanks for choosing [facility] we consider it a privelige to serve you.

## 2024-09-20 NOTE — Assessment & Plan Note (Signed)
 Food from the clinic pantry given Continued housing insecurity with living in a shed. No working phone for communication. Medicaid approval is pending, which may assist with medication access. - Coordinated with clinical pharmacist to ensure medications are available at pharmacy.

## 2024-09-21 LAB — MICROALBUMIN / CREATININE URINE RATIO
Creatinine, Urine: 18.8 mg/dL
Microalb/Creat Ratio: 84 mg/g{creat} — ABNORMAL HIGH (ref 0–29)
Microalbumin, Urine: 15.7 ug/mL

## 2024-09-22 ENCOUNTER — Other Ambulatory Visit (HOSPITAL_COMMUNITY): Payer: Self-pay

## 2024-09-22 ENCOUNTER — Telehealth: Payer: Self-pay

## 2024-09-22 DIAGNOSIS — E1165 Type 2 diabetes mellitus with hyperglycemia: Secondary | ICD-10-CM

## 2024-09-22 DIAGNOSIS — K8689 Other specified diseases of pancreas: Secondary | ICD-10-CM

## 2024-09-22 MED ORDER — INSULIN LISPRO PROT & LISPRO (75-25 MIX) 100 UNIT/ML KWIKPEN
16.0000 [IU] | PEN_INJECTOR | Freq: Two times a day (BID) | SUBCUTANEOUS | 11 refills | Status: AC
  Filled 2024-09-22: qty 15, 47d supply, fill #0

## 2024-09-22 MED ORDER — CREON 24000-76000 UNITS PO CPEP
ORAL_CAPSULE | ORAL | 3 refills | Status: AC
  Filled 2024-09-22: qty 300, 37d supply, fill #0

## 2024-09-22 NOTE — Progress Notes (Signed)
 Patient brought paperwork to clinic that indicated he was approved for Medicaid. Medicaid ID: [id number] R.   However, he did not have his pharmacy benefits card. At the end of the visit, information for [facility] was pulling into [employer], but this information is no longer available. Assuming patient's Medicaid Rx information will population shortly, resent prescriptions for Creon  (severe pancreatic insufficiency) and Humalog  75/25, which he can continue taking at a dose of 16 units BID, to [employer] where he can utilize an AR account to obtain medications given current inability to provide copayment.   Given severe pancreatic insufficiency, suspect minimal residual insulin  secretion, therefore patient would benefit from prandial insulin . However, given multiple SDOH barriers to care will continue simple regimen with twice daily dosing of premixed insulin .   Will attempt to follow-up with patient and/or his sister Donnia) about status of Medicaid and plan for obtaining prescription medications.   Lorain Baseman, PharmD [facility] [phone]

## 2024-09-23 ENCOUNTER — Other Ambulatory Visit (HOSPITAL_COMMUNITY): Payer: Self-pay

## 2024-09-24 ENCOUNTER — Other Ambulatory Visit: Payer: Self-pay

## 2024-10-11 ENCOUNTER — Ambulatory Visit: Payer: Self-pay | Admitting: Nurse Practitioner

## 2024-10-13 ENCOUNTER — Ambulatory Visit: Admitting: Nurse Practitioner

## 2024-10-16 ENCOUNTER — Ambulatory Visit: Payer: Self-pay | Admitting: Gastroenterology

## 2024-10-24 ENCOUNTER — Ambulatory Visit: Payer: Self-pay
